# Patient Record
Sex: Female | Born: 1980 | Hispanic: No | Marital: Married | Smoking: Never smoker
Health system: Southern US, Community
[De-identification: ages and names within clinical notes are randomized; demographics above are authoritative.]

## PROBLEM LIST (undated history)

## (undated) ENCOUNTER — Inpatient Hospital Stay (HOSPITAL_COMMUNITY): Payer: Self-pay

## (undated) DIAGNOSIS — E119 Type 2 diabetes mellitus without complications: Secondary | ICD-10-CM

## (undated) DIAGNOSIS — J45909 Unspecified asthma, uncomplicated: Secondary | ICD-10-CM

## (undated) DIAGNOSIS — Z789 Other specified health status: Secondary | ICD-10-CM

---

## 2010-03-16 ENCOUNTER — Inpatient Hospital Stay (INDEPENDENT_AMBULATORY_CARE_PROVIDER_SITE_OTHER)
Admission: RE | Admit: 2010-03-16 | Discharge: 2010-03-16 | Disposition: A | Discharge status: Home / Self Care | Payer: Self-pay | Source: Ambulatory Visit

## 2010-03-16 DIAGNOSIS — N912 Amenorrhea, unspecified: Secondary | ICD-10-CM

## 2010-03-16 LAB — POCT URINALYSIS DIPSTICK
Hgb urine dipstick: NEGATIVE
Ketones, ur: 15 mg/dL — AB
Specific Gravity, Urine: <=1.005 (ref 1.005–1.030)
Urine Glucose, Fasting: NEGATIVE mg/dL
Urobilinogen, UA: 0.2 mg/dL (ref 0.0–1.0)

## 2010-03-16 LAB — POCT PREGNANCY, URINE: Preg Test, Ur: NEGATIVE

## 2011-01-18 ENCOUNTER — Other Ambulatory Visit (HOSPITAL_COMMUNITY): Payer: Self-pay | Admitting: Obstetrics and Gynecology

## 2011-01-20 ENCOUNTER — Ambulatory Visit (HOSPITAL_COMMUNITY)
Admission: RE | Admit: 2011-01-20 | Discharge: 2011-01-20 | Disposition: A | Discharge status: Home / Self Care | Payer: Medicaid Other | Source: Ambulatory Visit | Attending: Obstetrics and Gynecology | Admitting: Obstetrics and Gynecology

## 2011-01-20 DIAGNOSIS — Z3689 Encounter for other specified antenatal screening: Secondary | ICD-10-CM | POA: Insufficient documentation

## 2011-01-20 DIAGNOSIS — O365990 Maternal care for other known or suspected poor fetal growth, unspecified trimester, not applicable or unspecified: Secondary | ICD-10-CM | POA: Insufficient documentation

## 2011-02-08 ENCOUNTER — Encounter (HOSPITAL_COMMUNITY): Payer: Self-pay | Admitting: Anesthesiology

## 2011-02-08 ENCOUNTER — Encounter (HOSPITAL_COMMUNITY): Payer: Self-pay | Admitting: *Deleted

## 2011-02-08 ENCOUNTER — Inpatient Hospital Stay (HOSPITAL_COMMUNITY)
Admission: AD | Admit: 2011-02-08 | Discharge: 2011-02-11 | DRG: 765 | Disposition: A | Discharge status: Home / Self Care | Payer: Medicaid Other | Source: Ambulatory Visit | Attending: Obstetrics & Gynecology | Admitting: Obstetrics & Gynecology

## 2011-02-08 ENCOUNTER — Encounter (HOSPITAL_COMMUNITY)
Admission: AD | Disposition: A | Discharge status: Home / Self Care | Payer: Self-pay | Source: Ambulatory Visit | Attending: Obstetrics & Gynecology

## 2011-02-08 ENCOUNTER — Other Ambulatory Visit: Payer: Self-pay | Admitting: Obstetrics & Gynecology

## 2011-02-08 ENCOUNTER — Inpatient Hospital Stay (HOSPITAL_COMMUNITY): Payer: Medicaid Other | Admitting: Anesthesiology

## 2011-02-08 DIAGNOSIS — O4590 Premature separation of placenta, unspecified, unspecified trimester: Secondary | ICD-10-CM | POA: Diagnosis present

## 2011-02-08 HISTORY — DX: Other specified health status: Z78.9

## 2011-02-08 LAB — CBC
MCH: 31.9 pg (ref 26.0–34.0)
MCH: 31.4 pg (ref 26.0–34.0)
MCHC: 34.8 g/dL (ref 30.0–36.0)
MCHC: 34 g/dL (ref 30.0–36.0)
MCV: 91.9 fL (ref 78.0–100.0)
MCV: 92.3 fL (ref 78.0–100.0)
Platelets: 352 10*3/uL (ref 150–400)
Platelets: 296 10*3/uL (ref 150–400)
RBC: 4.04 MIL/uL (ref 3.87–5.11)
RDW: 13.3 % (ref 11.5–15.5)

## 2011-02-08 LAB — URINE MICROSCOPIC-ADD ON: RBC / HPF: NONE SEEN RBC/hpf (ref <3)

## 2011-02-08 LAB — URINALYSIS, ROUTINE W REFLEX MICROSCOPIC
Glucose, UA: NEGATIVE mg/dL
Ketones, ur: NEGATIVE mg/dL
Nitrite: NEGATIVE
Specific Gravity, Urine: <1.005 — ABNORMAL LOW (ref 1.005–1.030)
pH: 6.5 (ref 5.0–8.0)

## 2011-02-08 LAB — RPR: RPR Ser Ql: NONREACTIVE

## 2011-02-08 SURGERY — Surgical Case
Anesthesia: General | Site: Abdomen | Wound class: Clean Contaminated

## 2011-02-08 MED ORDER — HYDROMORPHONE 0.3 MG/ML IV SOLN
INTRAVENOUS | Status: DC
  Administered 2011-02-08: 0.2 mg via INTRAVENOUS
  Administered 2011-02-08: 05:00:00 via INTRAVENOUS
  Administered 2011-02-08: 0.2 mg via INTRAVENOUS

## 2011-02-08 MED ORDER — SODIUM CHLORIDE 0.9 % IJ SOLN: 9.0000 mL | INTRAMUSCULAR | Status: DC | PRN

## 2011-02-08 MED ORDER — IBUPROFEN 600 MG PO TABS
600.0000 mg | ORAL_TABLET | Freq: Four times a day (QID) | ORAL | Status: DC
  Administered 2011-02-08 – 2011-02-11 (×11): 600 mg via ORAL
  Filled 2011-02-08 (×11): qty 1

## 2011-02-08 MED ORDER — HYDROMORPHONE HCL PF 1 MG/ML IJ SOLN
0.2500 mg | INTRAMUSCULAR | Status: DC | PRN
  Administered 2011-02-08 (×2): 0.5 mg via INTRAVENOUS

## 2011-02-08 MED ORDER — DIPHENHYDRAMINE HCL 50 MG/ML IJ SOLN: 12.5000 mg | Freq: Four times a day (QID) | INTRAMUSCULAR | Status: DC | PRN

## 2011-02-08 MED ORDER — MORPHINE SULFATE (PF) 0.5 MG/ML IJ SOLN
INTRAMUSCULAR | Status: DC | PRN
  Administered 2011-02-08: 5 mg via INTRAVENOUS

## 2011-02-08 MED ORDER — TETANUS-DIPHTH-ACELL PERTUSSIS 5-2.5-18.5 LF-MCG/0.5 IM SUSP
0.5000 mL | Freq: Once | INTRAMUSCULAR | Status: DC
  Filled 2011-02-08: qty 0.5

## 2011-02-08 MED ORDER — CITRIC ACID-SODIUM CITRATE 334-500 MG/5ML PO SOLN
30.0000 mL | Freq: Once | ORAL | Status: DC
  Filled 2011-02-08: qty 30

## 2011-02-08 MED ORDER — NALOXONE HCL 0.4 MG/ML IJ SOLN: 0.4000 mg | INTRAMUSCULAR | Status: DC | PRN

## 2011-02-08 MED ORDER — OXYTOCIN 20 UNITS IN LACTATED RINGERS INFUSION - SIMPLE
125.0000 mL/h | INTRAVENOUS | Status: AC
  Administered 2011-02-08: 125 mL/h via INTRAVENOUS
  Filled 2011-02-08: qty 1000

## 2011-02-08 MED ORDER — ONDANSETRON HCL 4 MG/2ML IJ SOLN: 4.0000 mg | Freq: Four times a day (QID) | INTRAMUSCULAR | Status: DC | PRN

## 2011-02-08 MED ORDER — CITRIC ACID-SODIUM CITRATE 334-500 MG/5ML PO SOLN
ORAL | Status: AC
  Administered 2011-02-08: 30 mL
  Filled 2011-02-08: qty 15

## 2011-02-08 MED ORDER — DIBUCAINE 1 % RE OINT: 1.0000 "application " | TOPICAL_OINTMENT | RECTAL | Status: DC | PRN

## 2011-02-08 MED ORDER — ONDANSETRON HCL 4 MG PO TABS: 4.0000 mg | ORAL_TABLET | ORAL | Status: DC | PRN

## 2011-02-08 MED ORDER — OXYCODONE-ACETAMINOPHEN 5-325 MG PO TABS
1.0000 | ORAL_TABLET | ORAL | Status: DC | PRN
  Administered 2011-02-08: 1 via ORAL
  Filled 2011-02-08: qty 1

## 2011-02-08 MED ORDER — FAMOTIDINE IN NACL 20-0.9 MG/50ML-% IV SOLN
INTRAVENOUS | Status: AC
  Administered 2011-02-08: 20 mg
  Filled 2011-02-08: qty 50

## 2011-02-08 MED ORDER — PROPOFOL 10 MG/ML IV EMUL
INTRAVENOUS | Status: DC | PRN
  Administered 2011-02-08: 150 mL via INTRAVENOUS
  Administered 2011-02-08: 50 mL via INTRAVENOUS

## 2011-02-08 MED ORDER — FENTANYL CITRATE 0.05 MG/ML IJ SOLN
INTRAMUSCULAR | Status: DC | PRN
  Administered 2011-02-08: 100 ug via INTRAVENOUS

## 2011-02-08 MED ORDER — LANOLIN HYDROUS EX OINT: 1.0000 "application " | TOPICAL_OINTMENT | CUTANEOUS | Status: DC | PRN

## 2011-02-08 MED ORDER — OXYTOCIN 20 UNITS IN LACTATED RINGERS INFUSION - SIMPLE
INTRAVENOUS | Status: DC | PRN
  Administered 2011-02-08: 20 [IU] via INTRAVENOUS

## 2011-02-08 MED ORDER — DIPHENHYDRAMINE HCL 25 MG PO CAPS: 25.0000 mg | ORAL_CAPSULE | Freq: Four times a day (QID) | ORAL | Status: DC | PRN

## 2011-02-08 MED ORDER — SUCCINYLCHOLINE CHLORIDE 20 MG/ML IJ SOLN
INTRAMUSCULAR | Status: DC | PRN
  Administered 2011-02-08: 100 mg via INTRAVENOUS

## 2011-02-08 MED ORDER — SIMETHICONE 80 MG PO CHEW: 80.0000 mg | CHEWABLE_TABLET | ORAL | Status: DC | PRN

## 2011-02-08 MED ORDER — OXYTOCIN 20 UNITS IN LACTATED RINGERS INFUSION - SIMPLE
INTRAVENOUS | Status: AC
  Administered 2011-02-08: 125 mL/h via INTRAVENOUS
  Filled 2011-02-08: qty 1000

## 2011-02-08 MED ORDER — CITRIC ACID-SODIUM CITRATE 334-500 MG/5ML PO SOLN
ORAL | Status: AC
  Filled 2011-02-08: qty 15

## 2011-02-08 MED ORDER — ONDANSETRON HCL 4 MG/2ML IJ SOLN: 4.0000 mg | INTRAMUSCULAR | Status: DC | PRN

## 2011-02-08 MED ORDER — PRENATAL MULTIVITAMIN CH
1.0000 | ORAL_TABLET | Freq: Every day | ORAL | Status: DC
  Administered 2011-02-09 – 2011-02-11 (×3): 1 via ORAL
  Filled 2011-02-08 (×5): qty 1

## 2011-02-08 MED ORDER — ONDANSETRON HCL 4 MG/2ML IJ SOLN
INTRAMUSCULAR | Status: DC | PRN
  Administered 2011-02-08: 4 mg via INTRAVENOUS

## 2011-02-08 MED ORDER — HYDROMORPHONE 0.3 MG/ML IV SOLN
INTRAVENOUS | Status: AC
  Filled 2011-02-08: qty 25

## 2011-02-08 MED ORDER — DIPHENHYDRAMINE HCL 12.5 MG/5ML PO ELIX: 12.5000 mg | ORAL_SOLUTION | Freq: Four times a day (QID) | ORAL | Status: DC | PRN

## 2011-02-08 MED ORDER — WITCH HAZEL-GLYCERIN EX PADS: 1.0000 "application " | MEDICATED_PAD | CUTANEOUS | Status: DC | PRN

## 2011-02-08 MED ORDER — SIMETHICONE 80 MG PO CHEW
80.0000 mg | CHEWABLE_TABLET | Freq: Three times a day (TID) | ORAL | Status: DC
  Administered 2011-02-08 – 2011-02-11 (×6): 80 mg via ORAL

## 2011-02-08 MED ORDER — FAMOTIDINE IN NACL 20-0.9 MG/50ML-% IV SOLN
20.0000 mg | Freq: Once | INTRAVENOUS | Status: DC
  Filled 2011-02-08: qty 50

## 2011-02-08 MED ORDER — HYDROMORPHONE HCL PF 1 MG/ML IJ SOLN
INTRAMUSCULAR | Status: AC
  Administered 2011-02-08: 0.5 mg via INTRAVENOUS
  Filled 2011-02-08: qty 1

## 2011-02-08 MED ORDER — SENNOSIDES-DOCUSATE SODIUM 8.6-50 MG PO TABS
2.0000 | ORAL_TABLET | Freq: Every day | ORAL | Status: DC
  Administered 2011-02-08 – 2011-02-10 (×3): 2 via ORAL

## 2011-02-08 MED ORDER — LACTATED RINGERS IV SOLN
INTRAVENOUS | Status: DC | PRN
  Administered 2011-02-08: 02:00:00 via INTRAVENOUS

## 2011-02-08 MED ORDER — LACTATED RINGERS IV SOLN: INTRAVENOUS | Status: DC

## 2011-02-08 MED ORDER — CEFAZOLIN SODIUM 1-5 GM-% IV SOLN
INTRAVENOUS | Status: AC
  Administered 2011-02-08: 1000 mg
  Filled 2011-02-08: qty 50

## 2011-02-08 MED ORDER — ZOLPIDEM TARTRATE 5 MG PO TABS: 5.0000 mg | ORAL_TABLET | Freq: Every evening | ORAL | Status: DC | PRN

## 2011-02-08 MED ORDER — MENTHOL 3 MG MT LOZG: 1.0000 | LOZENGE | OROMUCOSAL | Status: DC | PRN

## 2011-02-08 SURGICAL SUPPLY — 31 items
CLOTH BEACON ORANGE TIMEOUT ST (SAFETY) ×2 IMPLANT
DRESSING TELFA 8X3 (GAUZE/BANDAGES/DRESSINGS) ×4 IMPLANT
DRSG PAD ABDOMINAL 8X10 ST (GAUZE/BANDAGES/DRESSINGS) ×2 IMPLANT
ELECT REM PT RETURN 9FT ADLT (ELECTROSURGICAL) ×2
ELECTRODE REM PT RTRN 9FT ADLT (ELECTROSURGICAL) ×1 IMPLANT
EXTRACTOR VACUUM M CUP 4 TUBE (SUCTIONS) IMPLANT
GAUZE SPONGE 4X4 12PLY STRL LF (GAUZE/BANDAGES/DRESSINGS) ×4 IMPLANT
GLOVE BIO SURGEON STRL SZ7.5 (GLOVE) ×4 IMPLANT
GOWN PREVENTION PLUS LG XLONG (DISPOSABLE) ×2 IMPLANT
GOWN PREVENTION PLUS XLARGE (GOWN DISPOSABLE) ×2 IMPLANT
KIT ABG SYR 3ML LUER SLIP (SYRINGE) ×2 IMPLANT
NEEDLE HYPO 25X5/8 SAFETYGLIDE (NEEDLE) ×2 IMPLANT
NS IRRIG 1000ML POUR BTL (IV SOLUTION) ×2 IMPLANT
PACK C SECTION WH (CUSTOM PROCEDURE TRAY) ×2 IMPLANT
PAD ABD 7.5X8 STRL (GAUZE/BANDAGES/DRESSINGS) ×2 IMPLANT
SLEEVE SCD COMPRESS KNEE MED (MISCELLANEOUS) IMPLANT
SPONGE GAUZE 4X4 12PLY (GAUZE/BANDAGES/DRESSINGS) ×2 IMPLANT
STAPLER VISISTAT 35W (STAPLE) ×2 IMPLANT
SUT PLAIN 1 NONE 54 (SUTURE) ×2 IMPLANT
SUT PROLENE 0 TP 1 60 (SUTURE) IMPLANT
SUT VIC AB 0 CT1 27 (SUTURE) ×4
SUT VIC AB 0 CT1 27XBRD ANBCTR (SUTURE) ×4 IMPLANT
SUT VIC AB 1 CTX 36 (SUTURE) ×2
SUT VIC AB 1 CTX36XBRD ANBCTRL (SUTURE) ×2 IMPLANT
SUT VIC AB 3-0 SH 27 (SUTURE)
SUT VIC AB 3-0 SH 27X BRD (SUTURE) IMPLANT
SUT VICRYL 4-0 PS2 18IN ABS (SUTURE) IMPLANT
TAPE CLOTH SURG 4X10 WHT LF (GAUZE/BANDAGES/DRESSINGS) ×2 IMPLANT
TOWEL OR 17X24 6PK STRL BLUE (TOWEL DISPOSABLE) ×4 IMPLANT
TRAY FOLEY CATH 14FR (SET/KITS/TRAYS/PACK) ×2 IMPLANT
WATER STERILE IRR 1000ML POUR (IV SOLUTION) IMPLANT

## 2011-02-08 NOTE — Op Note (Signed)
Preoperative diagnoses  #1- 34 week plus intrauterine pregnancy  #2-very nonreassuring fetal heart tracing  #3-suspected abruption  Procedure  Emergency low transverse cesarean section with delivery of 4 pound 3.8 ounce female infant-arterial cord pH 7.17  Surgeon-Dr. Aldona Bar  Anesthesia Attempted spinal-Gen. Endotracheal  History-this G1 P0 at approximately [redacted] weeks gestation presented to maternity admissions with abdominal pain. Fetal tracing was very nonreassuring-there was no variability and there were very significant prolonged decelerations -questionable late decelerations. The patient's uterus was very tight and tense feeling-the clinical picture was consistent with an ongoing concealed abruption. The best option to me was immediate delivery by cesarean section and therefore the patient was rushed to the operating room for delivery.  Procedure-there was a brief attempt to place a spinal but this was unsuccessful. Therefore general endotracheal anesthesia was carried out and while anesthesia was being carried out the patient was prepped and draped and a Foley catheter was placed as part of the prep.  Once the patient was under anesthesia procedure was begun. A Pfannenstiel incision was made and with minimal difficulty dissected down sharply to and through the fashion a low transverse fashion. The subfascial space was created inferiorly and superiorly, muscle separated in the midline, peritoneum identified and entered bluntly with care taken to avoid the bowel superiorly the bladder inferiorly. At this time the vesicouterine peritoneum was incised in low transverse fashion and pushed off the lower uterine segment with ease. Sharp incisions the uterus the low transverse fashion was then carried out with Metzenbaum scissors. Amniotomy was produced and fluid was clear. The incision was extended laterally and thereafter from a vertex position a viable female infant which was very floppy was  delivered. The infant was passed off to the waiting neonatologist and resuscitative efforts began. The infant was ultimately taken to the newborn intensive care unit. In the intensive care unit the infant seemed to be doing well-the arterial cord pH returned at 7.17. Once all the core bloods were obtained the placenta was delivered. There did not appear to be any evidence of an abruption. Placenta was sent to pathology. Thereafter the uterus was exteriorized and rendered free of any remaining products of conception. Good uterine contractile it was affordable slowly given intravenous Pitocin and manual stimulation. Tubes and ovaries appeared normal. Closure of the uterine incision was then carried out using single layer of #1 Vicryl in a running locking fashion and this was oversewn with several figure-of-eight #1 Vicryl for additional hemostat this time the uterus was replaced to the abdominal cavity the abdomen was lavaged all free blood and clot. At this time all counts were to be correct and no foreign bodies were noted to be remaining in the abdominal cavity. Closure of the abdomen was begun at this time. The abdominal peritoneum was closed with 0 Vicryl in a running fashion.  Assured of good subfascial hemostasis the fascia was then reapproximated with 0 Vicryl in midline bilaterally. Subcutaneous tissue was rendered hemostatic. Staples were then used to close the skin. A sterile pressure dressing was applied and the patient was transported to the recovery room in satisfactory condition. Estimated blood loss 500 cc. All counts correct x2.   In summary this patient presented at [redacted] weeks gestation with clinically what was felt to be a concealed abruption with a very nonreassuring fetal heart tracing. Delivery was felt to be indicated immediately. She was taken to the operating room and underwent a stat cesarean section under general endotracheal anesthesia with delivery of a 4  pound 3.8 ounce female infant  whose arterial cord pH was 7.17.  At the conclusion of the procedure both mother and baby were doing well in the respective recovery areas.

## 2011-02-08 NOTE — Progress Notes (Signed)
Pt G1 at 34.6wks having contractions x 2-3 days.  Denies bleeding or leaking.

## 2011-02-08 NOTE — Progress Notes (Addendum)
  Patient is eating, ambulating, cath in place.  Pain control is good.  Filed Vitals:   02/08/11 0415 02/08/11 0445 02/08/11 0546 02/08/11 0647  BP: 142/77 146/85 136/80   Pulse: 72 70 68   Temp: 98.3 F (36.8 C) 98.5 F (36.9 C) 98.4 F (36.9 C)   TempSrc:   Oral   Resp: 20 18 18 18   Height:      Weight:      SpO2: 100% 100% 99% 100%    lungs:   clear to auscultation cor:    RRR Abdomen:  soft, appropriate tenderness, incisions intact and without erythema or exudate ex:    no cords   Lab Results  Component Value Date   WBC 12.3* 02/08/2011   HGB 12.7 02/08/2011   HCT 37.3 02/08/2011   MCV 92.3 02/08/2011   PLT 296 02/08/2011    --/--/A NEG (01/02 0145)/RI  A/P    Post operative day 0.  Routine post op and postpartum care.  Expect d/c in two to three days.  Percocet for pain control.

## 2011-02-08 NOTE — Progress Notes (Signed)
Pt having  Decelerations.IV bolus initiated pt turned to left side intially and right side and left side again O2 started at 75ml/min

## 2011-02-08 NOTE — Transfer of Care (Signed)
Immediate Anesthesia Transfer of Care Note  Patient: Stacey Graves  Procedure(s) Performed:  CESAREAN SECTION  Patient Location: PACU  Anesthesia Type: General  Level of Consciousness: alert  and oriented  Airway & Oxygen Therapy: Patient Spontanous Breathing and Patient connected to face mask oxygen  Post-op Assessment: Report given to PACU RN and Post -op Vital signs reviewed and stable  Post vital signs: stable  Complications: No apparent anesthesia complications

## 2011-02-08 NOTE — Anesthesia Preprocedure Evaluation (Signed)
Anesthesia Evaluation  Patient identified by MRN, date of birth, ID band Patient awake    Reviewed: Allergy & Precautions, H&P , Patient's Chart, lab work & pertinent test results  Airway Mallampati: II TM Distance: >3 FB Neck ROM: full    Dental No notable dental hx.    Pulmonary  clear to auscultation  Pulmonary exam normal       Cardiovascular Exercise Tolerance: Good regular Normal    Neuro/Psych    GI/Hepatic   Endo/Other    Renal/GU      Musculoskeletal   Abdominal   Peds  Hematology   Anesthesia Other Findings   Reproductive/Obstetrics                          Anesthesia Physical Anesthesia Plan  ASA: II and Emergent  Anesthesia Plan: Spinal   Post-op Pain Management:    Induction:   Airway Management Planned:   Additional Equipment:   Intra-op Plan:   Post-operative Plan:   Informed Consent: I have reviewed the patients History and Physical, chart, labs and discussed the procedure including the risks, benefits and alternatives for the proposed anesthesia with the patient or authorized representative who has indicated his/her understanding and acceptance.   Dental Advisory Given  Plan Discussed with: CRNA  Anesthesia Plan Comments: (Lab work confirmed with CRNA in room. Platelets okay. Discussed spinal anesthetic, and patient consents to the procedure:  included risk of possible headache,backache, failed block, allergic reaction, and nerve injury. This patient was asked if she had any questions or concerns before the procedure started. )       Anesthesia Quick Evaluation  

## 2011-02-08 NOTE — Addendum Note (Signed)
Addendum  created 02/08/11 1610 by Doreene Burke   Modules edited:Notes Section

## 2011-02-08 NOTE — ED Provider Notes (Signed)
History   Pt presents today c/o abd cramping for the past 2 days. She states she could not sleep so she presented to MAU for evaluation. She reports GFM and denies vag bleeding.  Chief Complaint  Patient presents with  . Contractions   HPI  OB History    Grav Para Term Preterm Abortions TAB SAB Ect Mult Living   1               Past Medical History  Diagnosis Date  . No pertinent past medical history     Past Surgical History  Procedure Date  . No past surgeries     Family History  Problem Relation Age of Onset  . Hypertension Mother   . Asthma Father     History  Substance Use Topics  . Smoking status: Never Smoker   . Smokeless tobacco: Not on file  . Alcohol Use: No    Allergies: No Known Allergies  No prescriptions prior to admission    Review of Systems  Constitutional: Negative for fever.  Eyes: Negative for blurred vision.  Cardiovascular: Negative for chest pain.  Gastrointestinal: Positive for abdominal pain. Negative for nausea, vomiting, diarrhea and constipation.  Genitourinary: Negative for dysuria, urgency, frequency and hematuria.  Neurological: Negative for dizziness and headaches.  Psychiatric/Behavioral: Negative for depression and suicidal ideas.   Physical Exam   Blood pressure 124/78, pulse 82, temperature 98.7 F (37.1 C), temperature source Oral, resp. rate 18, height 5\' 1"  (1.549 m), weight 136 lb 12.8 oz (62.052 kg).  Physical Exam  Nursing note and vitals reviewed. Constitutional: She is oriented to person, place, and time. She appears well-developed and well-nourished. No distress.  HENT:  Head: Normocephalic and atraumatic.  Eyes: EOM are normal. Pupils are equal, round, and reactive to light.  GI: Soft. She exhibits no distension. There is no tenderness. There is no rebound and no guarding.  Genitourinary: No bleeding around the vagina. No vaginal discharge found.       Cervix Cl/70/-2.  Neurological: She is alert and  oriented to person, place, and time.  Skin: Skin is warm and dry. She is not diaphoretic.  Psychiatric: She has a normal mood and affect. Her behavior is normal. Judgment and thought content normal.    MAU Course  Procedures  NST with repetitive decelerations.  Dr. Aldona Bar notified and on his way.  Assessment and Plan  Dr Aldona Bar at bedside at 0200 and is assuming care of pt.  Clinton Gallant. Rice III, DrHSc, MPAS, PA-C  02/08/2011, 1:52 AM   Henrietta Hoover, PA 02/08/11 0201

## 2011-02-08 NOTE — Anesthesia Postprocedure Evaluation (Signed)
Anesthesia Post Note  Patient: Stacey Graves  Procedure(s) Performed:  CESAREAN SECTION  Anesthesia type: General  Patient location: Mother/Baby  Post pain: Pain level controlled  Post assessment: Post-op Vital signs reviewed  Last Vitals:  Filed Vitals:   02/08/11 0647  BP:   Pulse:   Temp:   Resp: 18    Post vital signs: Reviewed  Level of consciousness: awake and alert   Complications: No apparent anesthesia complications

## 2011-02-08 NOTE — Anesthesia Postprocedure Evaluation (Signed)
  Anesthesia Post-op Note  Patient: Stacey Graves  Procedure(s) Performed:  CESAREAN SECTION Patient is awake and responsive. Pain and nausea are reasonably well controlled. Vital signs are stable and clinically acceptable. Oxygen saturation is clinically acceptable. There are no apparent anesthetic complications at this time. Patient is ready for discharge.

## 2011-02-08 NOTE — Progress Notes (Signed)
Pt states she started feeling pain about 1 hour ago

## 2011-02-08 NOTE — H&P (Signed)
G39P32 31 year old presentsd with one hour of Abd pain (?contractions) and on presentation to MAU, FHT VERY N-R.  Possible Abruption.  To OR for C/S ASAP.  VS;  Stable ABD:  Tense abd.  S=D CX Closed  IMP 34 weeks, Fetal Distress //Abruption  Plan:  STAT C/S.

## 2011-02-08 NOTE — Progress Notes (Signed)
Pt to OR via stretcher with O2 via facemask at 10L/min.

## 2011-02-08 NOTE — Progress Notes (Signed)
UR chart review completed.  

## 2011-02-09 ENCOUNTER — Encounter (HOSPITAL_COMMUNITY): Payer: Self-pay | Admitting: Obstetrics & Gynecology

## 2011-02-09 NOTE — Progress Notes (Signed)
Patient ID: Stacey Graves, female   DOB: 27-Sep-1980, 31 y.o.   MRN: [id number]  Pt off floor in NICU. Unable to examine

## 2011-02-09 NOTE — Progress Notes (Signed)
PSYCHOSOCIAL ASSESSMENT ~ MATERNAL/CHILD Name: Baby girl Stengel "Stacey Graves"                                                             Age: 31   Referral Date: 02/09/11 Reason/Source: NICU support  I. FAMILY/HOME ENVIRONMENT Child's Legal Guardian __x_Parent(s) ___Grandparent ___Foster parent ___DSS_________________ Name: Orpah Cobb                                           DOB: [birth year]             Age: 57   Address: [address] [place], [place] [address]  Name: Granville Lewis                                                    DOB: //                     Age:   Address: same  Other Household Members/Support Persons        Name:                                         Relationship:                        DOB ___/___/___                   Name:                                         Relationship:                        DOB ___/___/___                   Name:                                         Relationship:                        DOB ___/___/___                   Name:                                         Relationship:                        DOB ___/___/___  C. 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    STRENGTHS _x__Supportive family/friends _x__Adequate Resources _x__Compliance with medical plan _x__Home prepared for Child (including basic supplies) _x__Understanding of illness      _x__Other: SW gave pediatrician list  RISK FACTORS AND CURRENT PROBLEMS         __x__No Problems Noted                                                                                                                                                                                                                                       Pt              Family     Substance Abuse                                                                ___              ___        Mental Illness  ___              ___  Family/Relationship Issues                                      ___               ___             Abuse/Neglect/Domestic Violence                                         ___         ___  Financial Resources                                        ___              ___             Transportation                                                                        ___               ___  DSS Involvement                                                                   ___              ___  Adjustment to Illness                                                               ___              ___  Knowledge/Cognitive Deficit                                                   ___              ___             Compliance with Treatment                                                 ___                ___  Basic Needs (food, housing, etc.)                                          ___              ___             Housing Concerns                                       ___              ___ Other_____________________________________________________________            SOCIAL WORK ASSESSMENT SW met with MOB and her good friend MOB's third  floor room to introduce myself, complete assessment and evaluate how family is coping with baby's premature birth and admission to NICU.  MOB was quiet, but pleasant and stated that she speaks [place], but her friend is more fluent and can help if there is anything she doesn't understand.  She reports having good supports and everything she needs for baby at home.  MOB told SW that she has lived in [place] for one year and is originally from [place].  She states no questions about the NICU other than wanting to know when the baby is going to be able to go home.  SW explained that there is no way to predict what day the baby will be ready for discharge at this point, but discussed the typical milestones a NICU baby has to meet before being able to go home.  MOB was very appreciative and seems to be coping well with the situation.  She states that FOB is involved and supportive, but is at work today.  They have no issues with transportation in order to visit baby if MOB is discharged first.  SW explained support services offered by NICU SWs, and gave contact information and a pediatrician list.  SOCIAL WORK PLAN  ___No Further Intervention Required/No Barriers to Discharge   _x__Psychosocial Support and Ongoing Assessment of Needs   ___Patient/Family Education:   ___Child Protective Services Report   County___________ Date___/____/____   ___Information/Referral to [employer]_________________________   ___Other:

## 2011-02-09 NOTE — Progress Notes (Signed)
Subjective: Postpartum Day 1: Cesarean Delivery Patient reports incisional pain, tolerating PO, + flatus and no problems voiding.  There is some language barrier.  I discussed patients care with the RN.  The patient has not communicate any concerns to her  Objective: Vital signs in last 24 hours: Temp:  [98.4 F (36.9 C)-99.8 F (37.7 C)] 98.7 F (37.1 C) (01/03 0537) Pulse Rate:  [71-97] 97  (01/03 0537) Resp:  [18] 18  (01/03 0537) BP: (99-138)/(68-83) 99/70 mmHg (01/03 0537) SpO2:  [97 %] 97 % (01/03 0537)  Physical Exam:  General: alert, cooperative and appears stated age Lochia: appropriate Uterine Fundus: firm Incision: dressing intact DVT Evaluation: No evidence of DVT seen on physical exam.   Basename 02/08/11 0559 02/08/11 0145  HGB 12.7 13.8  HCT 37.3 39.7    Assessment/Plan: Status post Cesarean section. Doing well postoperatively.  Continue current care.   Airabella Barley H. 02/09/2011, 10:14 AM

## 2011-02-10 LAB — TYPE AND SCREEN

## 2011-02-10 NOTE — Progress Notes (Signed)
Post Op Day 2 Subjective: no complaints  Objective: Blood pressure 140/83, pulse 61, temperature 97.9 F.  Breastfeeding (pumping, baby in NICU).  Physical Exam:  General: alert Lochia: appropriate Uterine Fundus: firm Incision: healing well   Basename 02/08/11 0559 02/08/11 0145  HGB 12.7 13.8  HCT 37.3 39.7    Assessment/Plan: Plan for discharge tomorrow   LOS: 2 days   Stacey Graves D 02/10/2011, 8:49 AM

## 2011-02-11 MED ORDER — OXYCODONE-ACETAMINOPHEN 5-325 MG PO TABS: 1.0000 | ORAL_TABLET | ORAL | Status: AC | PRN

## 2011-02-11 NOTE — Discharge Summary (Signed)
Obstetric Discharge Summary Reason for Admission: cesarean section and for abdominal pain and category 3 fetal heart rate tracing Prenatal Procedures: ultrasound Intrapartum Procedures: cesarean: low cervical, transverse Postpartum Procedures: none Complications-Operative and Postpartum: none Hemoglobin  Date Value Range Status  02/08/2011 12.7  12.0-15.0 (g/dL) Final     HCT  Date Value Range Status  02/08/2011 37.3  36.0-46.0 (%) Final    Discharge Diagnoses: Preterm delivery by cesarean for unexplained abdominal pain and category 3 fetal heart rate tracing  Discharge Information: Date: 02/11/2011 Activity: Limited Diet: routine Medications: PNV, Ibuprofen and Percocet Condition: stable Instructions: refer to practice specific booklet Discharge to: home Follow-up Information    Follow up with Caprice Beaver. Make an appointment in 4 weeks.   Contact information:   [address] [place] [address] [phone]          Newborn Data: Live born female  Birth Weight: 4 lb 3.8 oz (1923 g) APGAR: 5, 7  Baby doing well in NICU.  Caysie Minnifield D 02/11/2011, 10:18 AM

## 2011-02-11 NOTE — Progress Notes (Signed)
Post Op Day 3 Subjective: no complaints, voiding, tolerating PO and + flatus  Objective: Blood pressure 128/84, pulse 68, temperature 98.5 F   Physical Exam:  General: alert Lochia: appropriate Uterine Fundus: firm Incision: healing well  Assessment/Plan: Discharge home   LOS: 3 days   London Nonaka D 02/11/2011, 10:07 AM

## 2012-08-09 ENCOUNTER — Inpatient Hospital Stay (HOSPITAL_COMMUNITY)
Admission: AD | Admit: 2012-08-09 | Discharge: 2012-08-09 | Disposition: A | Discharge status: Home / Self Care | Payer: Medicaid Other | Source: Ambulatory Visit | Attending: Obstetrics & Gynecology | Admitting: Obstetrics & Gynecology

## 2012-08-09 ENCOUNTER — Encounter (HOSPITAL_COMMUNITY): Payer: Self-pay | Admitting: *Deleted

## 2012-08-09 DIAGNOSIS — O210 Mild hyperemesis gravidarum: Secondary | ICD-10-CM | POA: Insufficient documentation

## 2012-08-09 DIAGNOSIS — O219 Vomiting of pregnancy, unspecified: Secondary | ICD-10-CM

## 2012-08-09 DIAGNOSIS — R109 Unspecified abdominal pain: Secondary | ICD-10-CM | POA: Insufficient documentation

## 2012-08-09 LAB — URINALYSIS, ROUTINE W REFLEX MICROSCOPIC
Bilirubin Urine: NEGATIVE
Ketones, ur: NEGATIVE mg/dL
Nitrite: NEGATIVE
Protein, ur: 30 mg/dL — AB
pH: 6.5 (ref 5.0–8.0)

## 2012-08-09 LAB — URINE MICROSCOPIC-ADD ON

## 2012-08-09 MED ORDER — ONDANSETRON HCL 4 MG PO TABS: 4.0000 mg | ORAL_TABLET | Freq: Three times a day (TID) | ORAL | Status: DC | PRN

## 2012-08-09 MED ORDER — ONDANSETRON 8 MG PO TBDP
8.0000 mg | ORAL_TABLET | Freq: Once | ORAL | Status: AC
  Administered 2012-08-09: 8 mg via ORAL
  Filled 2012-08-09: qty 1

## 2012-08-09 NOTE — MAU Provider Note (Signed)
History     CSN: [id number]  Arrival date and time: 08/09/12 1129   First Provider Initiated Contact with Patient 08/09/12 1216      Chief Complaint  Patient presents with  . Emesis  . Abdominal Cramping   HPI Stacey Graves is 32 y.o. G2P0101 [redacted]w[redacted]d weeks presenting with nausea and vomiting in early pregnancy.  She has appointment with [facility] to begin prenatal care on 08/12/12.  Reports nausea for 2 months, daily.  Vomits 3-4 X day.  Is able to keep food and fluid down sometimes.  Denies vaginal bleeding or discharge.  Denies abdominal pain except when vomiting.  Denies fever and UTI sxs.    Past Medical History  Diagnosis Date  . No pertinent past medical history   . Preterm delivery     Past Surgical History  Procedure Laterality Date  . No past surgeries    . Cesarean section  02/08/2011    Procedure: CESAREAN SECTION;  Surgeon: Caprice Beaver;  Location: [facility];  Service: Gynecology;  Laterality: N/A;    Family History  Problem Relation Age of Onset  . Hypertension Mother   . Asthma Father     History  Substance Use Topics  . Smoking status: Never Smoker   . Smokeless tobacco: Never Used  . Alcohol Use: No    Allergies: No Known Allergies  Prescriptions prior to admission  Medication Sig Dispense Refill  . Prenatal Vit-Fe Fumarate-FA (PRENATAL MULTIVITAMIN) TABS Take 1 tablet by mouth daily.         Review of Systems  Constitutional: Negative for fever and chills.  Gastrointestinal: Positive for nausea. Negative for vomiting.  Genitourinary: Negative for dysuria, urgency, frequency, hematuria and flank pain.       Neg for vaginal bleeding or discharge   Physical Exam   Blood pressure 113/65, pulse 98, temperature 98.2 F (36.8 C), temperature source Oral, resp. rate 18, height 5\' 1"  (1.549 m), weight 115 lb 6.4 oz (52.345 kg), last menstrual period 05/22/2012.  Physical Exam  Constitutional: She is oriented to person, place, and time. She appears well-developed  and well-nourished. No distress.  HENT:  Head: Normocephalic.  Neck: Normal range of motion.  Cardiovascular: Normal rate.   Respiratory: Effort normal.  GI: Soft. She exhibits no distension and no mass. There is no tenderness. There is no rebound and no guarding.  Genitourinary:  Not indicated.  FHR 168 by doppler  Neurological: She is alert and oriented to person, place, and time.  Skin: Skin is warm and dry.  Psychiatric: She has a normal mood and affect. Her behavior is normal.   Results for orders placed during the hospital encounter of 08/09/12 (from the past 24 hour(s))  URINALYSIS, ROUTINE W REFLEX MICROSCOPIC     Status: Abnormal   Collection Time    08/09/12 11:35 AM      Result Value Range   Color, Urine YELLOW  YELLOW   APPearance HAZY (*) CLEAR   Specific Gravity, Urine >1.030 (*) 1.005 - 1.030   pH 6.5  5.0 - 8.0   Glucose, UA NEGATIVE  NEGATIVE mg/dL   Hgb urine dipstick NEGATIVE  NEGATIVE   Bilirubin Urine NEGATIVE  NEGATIVE   Ketones, ur NEGATIVE  NEGATIVE mg/dL   Protein, ur 30 (*) NEGATIVE mg/dL   Urobilinogen, UA 0.2  0.0 - 1.0 mg/dL   Nitrite NEGATIVE  NEGATIVE   Leukocytes, UA SMALL (*) NEGATIVE  URINE MICROSCOPIC-ADD ON     Status: Abnormal  Collection Time    08/09/12 11:35 AM      Result Value Range   Squamous Epithelial / LPF FEW (*) RARE   WBC, UA 3-6  <3 WBC/hpf   RBC / HPF 0-2  <3 RBC/hpf   Bacteria, UA MANY (*) RARE   Urine-Other MUCOUS PRESENT    POCT PREGNANCY, URINE     Status: Abnormal   Collection Time    08/09/12 11:45 AM      Result Value Range   Preg Test, Ur POSITIVE (*) NEGATIVE    MAU Course  Procedures  Urine culture to lab  MDM Zofran 8mg  ODT given in MAU with good response.  Discussed medications for home for her nausea.  She does not want to be sleepy.  Will Rx Zofran 4mg  tabs Assessment and Plan  A:  Nausea and vomiting in first trimester pregnancy]      Viable pregnancy with FHR 168 by doppler  P:  Rx for Zofran  4mg  tabs to pharmacy      Encouraged patient to keep appointment with [facility] for Monday July 7.      Discussed diet   KEY,EVE M 08/09/2012, 12:19 PM

## 2012-08-09 NOTE — MAU Provider Note (Signed)
Attestation of Attending Supervision of Advanced Practitioner (CNM/NP): Evaluation and management procedures were performed by the Advanced Practitioner under my supervision and collaboration. I have reviewed the Advanced Practitioner's note and chart, and I agree with the management and plan.  Vadim Centola H. 2:25 PM   

## 2012-08-09 NOTE — MAU Note (Signed)
Patient states she has had a positive pregnancy test at Urgent Care. States she has had nausea and vomiting for a while and some abdominal cramping.

## 2012-08-11 LAB — URINE CULTURE: Colony Count: 65000

## 2012-08-12 LAB — OB RESULTS CONSOLE GBS: STREP GROUP B AG: POSITIVE

## 2012-09-23 LAB — OB RESULTS CONSOLE HEPATITIS B SURFACE ANTIGEN: Hepatitis B Surface Ag: NEGATIVE

## 2012-09-23 LAB — OB RESULTS CONSOLE ABO/RH: RH Type: NEGATIVE

## 2012-09-23 LAB — OB RESULTS CONSOLE HIV ANTIBODY (ROUTINE TESTING): HIV: NONREACTIVE

## 2012-09-23 LAB — OB RESULTS CONSOLE GC/CHLAMYDIA
Chlamydia: NEGATIVE
Gonorrhea: NEGATIVE

## 2012-09-23 LAB — OB RESULTS CONSOLE RUBELLA ANTIBODY, IGM: Rubella: IMMUNE

## 2012-09-23 LAB — OB RESULTS CONSOLE RPR: RPR: NONREACTIVE

## 2012-09-23 LAB — OB RESULTS CONSOLE ANTIBODY SCREEN: ANTIBODY SCREEN: NEGATIVE

## 2012-11-19 ENCOUNTER — Other Ambulatory Visit: Payer: Self-pay | Admitting: Obstetrics and Gynecology

## 2012-11-19 ENCOUNTER — Ambulatory Visit (HOSPITAL_COMMUNITY)
Admission: RE | Admit: 2012-11-19 | Discharge: 2012-11-19 | Disposition: A | Discharge status: Home / Self Care | Payer: Medicaid Other | Source: Ambulatory Visit | Attending: Obstetrics and Gynecology | Admitting: Obstetrics and Gynecology

## 2012-11-19 DIAGNOSIS — IMO0002 Reserved for concepts with insufficient information to code with codable children: Secondary | ICD-10-CM

## 2012-11-19 DIAGNOSIS — O365990 Maternal care for other known or suspected poor fetal growth, unspecified trimester, not applicable or unspecified: Secondary | ICD-10-CM | POA: Insufficient documentation

## 2012-11-19 DIAGNOSIS — Z3689 Encounter for other specified antenatal screening: Secondary | ICD-10-CM

## 2012-11-19 DIAGNOSIS — O34219 Maternal care for unspecified type scar from previous cesarean delivery: Secondary | ICD-10-CM | POA: Insufficient documentation

## 2012-11-19 DIAGNOSIS — Z0374 Encounter for suspected problem with fetal growth ruled out: Secondary | ICD-10-CM

## 2012-11-19 DIAGNOSIS — Z1389 Encounter for screening for other disorder: Secondary | ICD-10-CM | POA: Insufficient documentation

## 2012-11-19 DIAGNOSIS — O358XX0 Maternal care for other (suspected) fetal abnormality and damage, not applicable or unspecified: Secondary | ICD-10-CM | POA: Insufficient documentation

## 2012-11-19 DIAGNOSIS — Z363 Encounter for antenatal screening for malformations: Secondary | ICD-10-CM | POA: Insufficient documentation

## 2012-11-29 ENCOUNTER — Other Ambulatory Visit (HOSPITAL_COMMUNITY): Payer: Medicaid Other

## 2012-11-29 ENCOUNTER — Encounter (HOSPITAL_COMMUNITY): Payer: Medicaid Other

## 2012-12-10 ENCOUNTER — Other Ambulatory Visit: Payer: Self-pay | Admitting: Obstetrics and Gynecology

## 2012-12-10 DIAGNOSIS — O365991 Maternal care for other known or suspected poor fetal growth, unspecified trimester, fetus 1: Secondary | ICD-10-CM

## 2012-12-11 ENCOUNTER — Ambulatory Visit (HOSPITAL_COMMUNITY)
Admission: RE | Admit: 2012-12-11 | Discharge: 2012-12-11 | Disposition: A | Discharge status: Home / Self Care | Payer: Medicaid Other | Source: Ambulatory Visit | Attending: Obstetrics and Gynecology | Admitting: Obstetrics and Gynecology

## 2012-12-11 DIAGNOSIS — O365991 Maternal care for other known or suspected poor fetal growth, unspecified trimester, fetus 1: Secondary | ICD-10-CM

## 2012-12-11 DIAGNOSIS — O34219 Maternal care for unspecified type scar from previous cesarean delivery: Secondary | ICD-10-CM | POA: Insufficient documentation

## 2012-12-11 DIAGNOSIS — Z8751 Personal history of pre-term labor: Secondary | ICD-10-CM | POA: Insufficient documentation

## 2012-12-11 DIAGNOSIS — O365990 Maternal care for other known or suspected poor fetal growth, unspecified trimester, not applicable or unspecified: Secondary | ICD-10-CM | POA: Insufficient documentation

## 2013-01-05 ENCOUNTER — Inpatient Hospital Stay (HOSPITAL_COMMUNITY)
Admission: AD | Admit: 2013-01-05 | Discharge: 2013-01-05 | Disposition: A | Discharge status: Home / Self Care | Payer: Medicaid Other | Source: Ambulatory Visit | Attending: Obstetrics and Gynecology | Admitting: Obstetrics and Gynecology

## 2013-01-05 ENCOUNTER — Encounter (HOSPITAL_COMMUNITY): Payer: Self-pay

## 2013-01-05 DIAGNOSIS — O99891 Other specified diseases and conditions complicating pregnancy: Secondary | ICD-10-CM | POA: Insufficient documentation

## 2013-01-05 DIAGNOSIS — K055 Other periodontal diseases: Secondary | ICD-10-CM | POA: Insufficient documentation

## 2013-01-05 DIAGNOSIS — Z789 Other specified health status: Secondary | ICD-10-CM

## 2013-01-05 DIAGNOSIS — IMO0002 Reserved for concepts with insufficient information to code with codable children: Secondary | ICD-10-CM | POA: Diagnosis present

## 2013-01-05 DIAGNOSIS — O34219 Maternal care for unspecified type scar from previous cesarean delivery: Secondary | ICD-10-CM | POA: Diagnosis not present

## 2013-01-05 DIAGNOSIS — Z6791 Unspecified blood type, Rh negative: Secondary | ICD-10-CM | POA: Diagnosis present

## 2013-01-05 DIAGNOSIS — Z758 Other problems related to medical facilities and other health care: Secondary | ICD-10-CM

## 2013-01-05 DIAGNOSIS — Z87898 Personal history of other specified conditions: Secondary | ICD-10-CM

## 2013-01-05 DIAGNOSIS — O99820 Streptococcus B carrier state complicating pregnancy: Secondary | ICD-10-CM

## 2013-01-05 LAB — WET PREP, GENITAL: Clue Cells Wet Prep HPF POC: NONE SEEN

## 2013-01-05 NOTE — MAU Note (Signed)
Pt/spouse state one hour ago pt began randomly having bleeding from her left upper gum. Did not chew anything hard. Denies issues with pregnancy, no bleeding or vag d/c changes.

## 2013-01-06 LAB — GC/CHLAMYDIA PROBE AMP: GC Probe RNA: NEGATIVE

## 2013-01-07 ENCOUNTER — Other Ambulatory Visit: Payer: Self-pay | Admitting: Obstetrics and Gynecology

## 2013-01-07 DIAGNOSIS — Z87898 Personal history of other specified conditions: Secondary | ICD-10-CM

## 2013-01-07 DIAGNOSIS — IMO0002 Reserved for concepts with insufficient information to code with codable children: Secondary | ICD-10-CM

## 2013-01-08 ENCOUNTER — Ambulatory Visit (HOSPITAL_COMMUNITY)
Admission: RE | Admit: 2013-01-08 | Discharge: 2013-01-08 | Disposition: A | Discharge status: Home / Self Care | Payer: Medicaid Other | Source: Ambulatory Visit | Attending: Obstetrics and Gynecology | Admitting: Obstetrics and Gynecology

## 2013-01-08 DIAGNOSIS — IMO0002 Reserved for concepts with insufficient information to code with codable children: Secondary | ICD-10-CM

## 2013-01-08 DIAGNOSIS — Z87898 Personal history of other specified conditions: Secondary | ICD-10-CM

## 2013-01-08 DIAGNOSIS — Z8751 Personal history of pre-term labor: Secondary | ICD-10-CM | POA: Insufficient documentation

## 2013-01-08 DIAGNOSIS — O34219 Maternal care for unspecified type scar from previous cesarean delivery: Secondary | ICD-10-CM | POA: Insufficient documentation

## 2013-01-08 DIAGNOSIS — O365990 Maternal care for other known or suspected poor fetal growth, unspecified trimester, not applicable or unspecified: Secondary | ICD-10-CM | POA: Insufficient documentation

## 2013-02-04 ENCOUNTER — Other Ambulatory Visit: Payer: Self-pay | Admitting: Obstetrics and Gynecology

## 2013-02-04 DIAGNOSIS — Z87898 Personal history of other specified conditions: Secondary | ICD-10-CM

## 2013-02-04 DIAGNOSIS — IMO0002 Reserved for concepts with insufficient information to code with codable children: Secondary | ICD-10-CM

## 2013-02-05 ENCOUNTER — Ambulatory Visit (HOSPITAL_COMMUNITY)
Admission: RE | Admit: 2013-02-05 | Discharge: 2013-02-05 | Disposition: A | Discharge status: Home / Self Care | Payer: Medicaid Other | Source: Ambulatory Visit | Attending: Obstetrics and Gynecology | Admitting: Obstetrics and Gynecology

## 2013-02-05 DIAGNOSIS — Z87898 Personal history of other specified conditions: Secondary | ICD-10-CM

## 2013-02-05 DIAGNOSIS — O365990 Maternal care for other known or suspected poor fetal growth, unspecified trimester, not applicable or unspecified: Secondary | ICD-10-CM | POA: Insufficient documentation

## 2013-02-05 DIAGNOSIS — O34219 Maternal care for unspecified type scar from previous cesarean delivery: Secondary | ICD-10-CM | POA: Insufficient documentation

## 2013-02-05 DIAGNOSIS — Z8751 Personal history of pre-term labor: Secondary | ICD-10-CM | POA: Insufficient documentation

## 2013-02-05 DIAGNOSIS — IMO0002 Reserved for concepts with insufficient information to code with codable children: Secondary | ICD-10-CM

## 2013-02-05 NOTE — Progress Notes (Signed)
Pierre Parcher  was seen today for an ultrasound appointment.  See full report in [facility].  Impression: Single IUP at 37 0/7 weeks Overall fetal growth is appropriate (21st %tile).  [facility] measures at the 24th %tile. 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   Alpha Gula, MD

## 2013-02-06 NOTE — L&D Delivery Note (Signed)
Delivery Note   [facility] pt began pushing about 10pm, with report from RN, difficulty w pt pushing effectively. Pt denies pain, but does feel pressure w ctx,  FHR cat 2, with intermittent mod variables w good recovery and mod variability. vtx at +2/+3 station, temp increased to 100.4, abx switched from PCN to unasyn 3g.  Position changes and continued pushing, [facility] for [facility] and considering vacuum assist. Vtx continued to descend and FHR overall reassuring, pt continued to be pushing ineffectively despite coaching. Ctx also not strong at times.    At 11:41 PM a viable female was delivered via VBAC, Spontaneous (Presentation: ; Occiput Anterior).  Reduced loose nuchal cord, APGAR: 8, 9; weight 6 lb 4.5 oz (2849 g).   Placenta status: Intact, Spontaneous.  To pathology, Cord: 2 vessels with the following complications: None.  Cord pH: not collected  Anesthesia: Epidural  Episiotomy: None Lacerations: 2nd degree repaired in the usual fashion, and superficial L labial laceration w 2 stiches for hemostasis, Superficial skin split on R labia not repaired  Suture Repair: chromic vicryl rapide 4-0 monocryl  Est. Blood Loss (mL): 300  cytotec 1000mcg PR for slightly boggy uterus during repair   Mom to postpartum.  Baby to Couplet care / Skin to Skin. Pt plans to BF BP's noted to still be elevated, will repeat PIH labs w CBC in the am, CTO closely  Will d/c abx for now, unless fever persists   Stacey Graves M 02/21/2013, 1:36 AM

## 2013-02-09 ENCOUNTER — Encounter (HOSPITAL_COMMUNITY): Payer: Self-pay | Admitting: *Deleted

## 2013-02-09 ENCOUNTER — Inpatient Hospital Stay (HOSPITAL_COMMUNITY)
Admission: AD | Admit: 2013-02-09 | Discharge: 2013-02-09 | Disposition: A | Discharge status: Home / Self Care | Payer: Medicaid Other | Source: Ambulatory Visit | Attending: Obstetrics and Gynecology | Admitting: Obstetrics and Gynecology

## 2013-02-09 DIAGNOSIS — O360990 Maternal care for other rhesus isoimmunization, unspecified trimester, not applicable or unspecified: Secondary | ICD-10-CM | POA: Insufficient documentation

## 2013-02-09 DIAGNOSIS — O368190 Decreased fetal movements, unspecified trimester, not applicable or unspecified: Secondary | ICD-10-CM | POA: Insufficient documentation

## 2013-02-09 DIAGNOSIS — O9989 Other specified diseases and conditions complicating pregnancy, childbirth and the puerperium: Secondary | ICD-10-CM

## 2013-02-09 DIAGNOSIS — Z22330 Carrier of Group B streptococcus: Secondary | ICD-10-CM | POA: Insufficient documentation

## 2013-02-09 DIAGNOSIS — O99891 Other specified diseases and conditions complicating pregnancy: Secondary | ICD-10-CM | POA: Insufficient documentation

## 2013-02-09 HISTORY — DX: Other specified health status: Z78.9

## 2013-02-09 NOTE — Discharge Instructions (Signed)
Fetal Monitoring, Fetal Movement Assessment Fetal movement assessment (FMA) is done by the pregnant woman herself by counting and recording the baby's movements over a certain time period. It is done to see if there are problems with the pregnancy and the baby. Identifying and correcting problems may prevent serious problems from developing with the fetus, including fetal loss. Some pregnancies are complicated by the mother's medical problems. Some of these problems are type 1 diabetes mellitus, high blood pressure and other chronic medical illnesses. This is why it is important to monitor the baby before birth.  OTHER TECHNIQUES OF MONITORING YOUR BABY BEFORE BIRTH Several tests are in use. These include:  Nonstress test (NST). This test monitors the baby's heart rate when the baby moves.  Contraction stress test (CST). This test monitors the baby's heart rate during a contraction of the uterus.  Fetal biophysical profile (BPP). This measures and evaluates 5 observations of the baby:  The nonstress test.  The baby's breathing.  The baby's movements.  The baby's muscle tone.  The amount of amniotic fluid.  Modified BPP. This measures the volume of fluid in different parts of the amniotic sac (amniotic fluid index) and the results of the nonstress test.  Umbilical artery doppler velocimetry. This evaluates the blood flow through the umbilical cord. There are several very serious problems that cannot be predicted or detected with any of the fetal monitoring procedures. These problems include separation (abruption) of the placenta or when the fetus chokes on the umbilical cord (umbilical cord accident). Your caregiver will help you understand your tests and what they mean for you and your baby. It is your responsibility to obtain the results of your test. LET YOUR CAREGIVER KNOW ABOUT:   Any medications you are taking including prescription and over-the-counter drugs, herbs, eye drops and  creams.  If you have a fever.  If you have an infection.  If you are sick. RISKS AND COMPLICATIONS  There are no risks or complications to the mother or fetus with FMA. BEFORE THE PROCEDURE  Do not take medications that may decrease or increase the baby's heart rate and/or movements.  Eat a full meal at least 2 hours before the test.  Do not smoke if you are pregnant. If you smoke, stop at least 2 days before the test. It is best not to smoke at all when you are pregnant. PROCEDURE Sometimes, a mother notices her baby moves less before there are problems. Because of this, it is believed that fetal movement checking by the mother (kick counts) is a good way to check the baby before birth. There are different ways of doing this. Two good ways are:  The woman lies on her side and counts distinct (individual) fetal movements. A feeling of 10 distinct movements during the time you are awake is considered reassuring. When 10 movements are felt, you may stop counting.  Women are instructed to count fetal movements for 1 hour, three times per week. The count is good if, after one week, it equals or is over the woman's previously established baseline count. If the count is lower, further checking of your baby is needed. AFTER THE PROCEDURE You may resume your usual activities. HOME CARE INSTRUCTIONS   Follow your caregiver's advice and recommendations.  Be aware of your baby's movements. Are they normal, less than usual or more than usual?  Make and keep the rest of your prenatal appointments. SEEK MEDICAL CARE IF:   You develop a temperature of 100  F (37.8 C) or higher.  You have a bloody mucus discharge from the vagina (a bloody show). SEEK IMMEDIATE MEDICAL CARE IF:   You do not feel the baby move.  You think the baby's movements are too little or too many.  You develop contractions.  You develop vaginal bleeding.  You have belly (abdominal) pain.  You have leaking or a  gush of fluid from the vagina. Document Released: 01/13/2002 Document Revised: 04/17/2011 Document Reviewed: 05/18/2008 [facility] Patient Information 2014 [place], [place].

## 2013-02-09 NOTE — MAU Note (Signed)
Patient presents with complaint of decreased fetal movement since last night. 

## 2013-02-09 NOTE — MAU Provider Note (Signed)
History   33 yo G2P0101 at 3337 4/7 weeks presented after calling with a report of decreased FM since last night.  Denies leaking or bleeding.    Now aware of FM.  Patient Active Problem List   Diagnosis Date Noted  . Language barrier--Sudanese 01/05/2013  . H/O poor fetal growth--AC and HC lag, ? constitutional vs IUGR 01/05/2013  . Previous cesarean delivery, antepartum condition or complication--desires VBAC 01/05/2013  . GBS (group B Streptococcus carrier), +RV culture, currently pregnant 01/05/2013  . Rh negative state in antepartum period 01/05/2013  . Marginal insertion of umbilical cord 01/05/2013    HPI:  See above  OB History   Grav Para Term Preterm Abortions TAB SAB Ect Mult Living   2 1  1      1       Past Medical History  Diagnosis Date  . No pertinent past medical history   . Preterm delivery     Past Surgical History  Procedure Laterality Date  . No past surgeries    . Cesarean section  02/08/2011    Procedure: CESAREAN SECTION;  Surgeon: Caprice Beaverobert M Wein;  Location: [facility];  Service: Gynecology;  Laterality: N/A;    Family History  Problem Relation Age of Onset  . Hypertension Mother   . Asthma Father     History  Substance Use Topics  . Smoking status: Never Smoker   . Smokeless tobacco: Never Used  . Alcohol Use: No    Allergies: No Known Allergies  Prescriptions prior to admission  Medication Sig Dispense Refill  . Prenatal Vit-Fe Fumarate-FA (PRENATAL MULTIVITAMIN) TABS Take 1 tablet by mouth daily.         ROS:  None Physical Exam   Last menstrual period 05/22/2012.  Physical Exam Chest clear Heart RRR without mumur Abd gravid, NT Pelvic--deferred Ext WNL  FHR Category 1 for 1 1/2 hour tracing. UCs very occasional  ED Course  IUP at 37 4/7 weeks Reactive FHR Good FM now  [facility]: [facility].    Nigel BridgemanLATHAM, Willis Holquin CNM, [place] 02/09/2013 11:49 AM

## 2013-02-19 ENCOUNTER — Encounter (HOSPITAL_COMMUNITY): Payer: Self-pay | Admitting: General Practice

## 2013-02-19 ENCOUNTER — Inpatient Hospital Stay (HOSPITAL_COMMUNITY)
Admission: AD | Admit: 2013-02-19 | Discharge: 2013-02-23 | DRG: 775 | Disposition: A | Discharge status: Home / Self Care | Payer: Medicaid Other | Source: Ambulatory Visit | Attending: Obstetrics and Gynecology | Admitting: Obstetrics and Gynecology

## 2013-02-19 DIAGNOSIS — O365990 Maternal care for other known or suspected poor fetal growth, unspecified trimester, not applicable or unspecified: Secondary | ICD-10-CM | POA: Diagnosis present

## 2013-02-19 DIAGNOSIS — Z22330 Carrier of Group B streptococcus: Secondary | ICD-10-CM

## 2013-02-19 DIAGNOSIS — O1490 Unspecified pre-eclampsia, unspecified trimester: Secondary | ICD-10-CM | POA: Diagnosis not present

## 2013-02-19 DIAGNOSIS — O9989 Other specified diseases and conditions complicating pregnancy, childbirth and the puerperium: Secondary | ICD-10-CM

## 2013-02-19 DIAGNOSIS — O34219 Maternal care for unspecified type scar from previous cesarean delivery: Principal | ICD-10-CM | POA: Diagnosis not present

## 2013-02-19 DIAGNOSIS — O99892 Other specified diseases and conditions complicating childbirth: Secondary | ICD-10-CM | POA: Diagnosis present

## 2013-02-19 DIAGNOSIS — O4290 Premature rupture of membranes, unspecified as to length of time between rupture and onset of labor, unspecified weeks of gestation: Secondary | ICD-10-CM | POA: Diagnosis present

## 2013-02-19 DIAGNOSIS — O411290 Chorioamnionitis, unspecified trimester, not applicable or unspecified: Secondary | ICD-10-CM | POA: Diagnosis not present

## 2013-02-19 DIAGNOSIS — O411090 Infection of amniotic sac and membranes, unspecified, unspecified trimester, not applicable or unspecified: Secondary | ICD-10-CM | POA: Diagnosis present

## 2013-02-19 LAB — TYPE AND SCREEN
ABO/RH(D): A NEG
Antibody Screen: NEGATIVE

## 2013-02-19 LAB — CBC
HEMATOCRIT: 43.4 % (ref 36.0–46.0)
Hemoglobin: 14.8 g/dL (ref 12.0–15.0)
MCH: 31.1 pg (ref 26.0–34.0)
MCHC: 34.1 g/dL (ref 30.0–36.0)
MCV: 91.2 fL (ref 78.0–100.0)
Platelets: 233 10*3/uL (ref 150–400)
RBC: 4.76 MIL/uL (ref 3.87–5.11)
RDW: 13.7 % (ref 11.5–15.5)
WBC: 7.8 10*3/uL (ref 4.0–10.5)

## 2013-02-19 LAB — POCT FERN TEST: POCT Fern Test: NEGATIVE

## 2013-02-19 LAB — RPR: RPR Ser Ql: NONREACTIVE

## 2013-02-19 LAB — AMNISURE RUPTURE OF MEMBRANE (ROM) NOT AT ARMC: Amnisure ROM: POSITIVE

## 2013-02-19 MED ORDER — OXYTOCIN 40 UNITS IN LACTATED RINGERS INFUSION - SIMPLE MED
1.0000 m[IU]/min | INTRAVENOUS | Status: DC
  Administered 2013-02-19: 5 m[IU]/min via INTRAVENOUS
  Administered 2013-02-19: 3 m[IU]/min via INTRAVENOUS
  Administered 2013-02-19: 9 m[IU]/min via INTRAVENOUS
  Administered 2013-02-19 – 2013-02-20 (×2): 1 m[IU]/min via INTRAVENOUS
  Administered 2013-02-20: 5 m[IU]/min via INTRAVENOUS
  Administered 2013-02-20: 3 m[IU]/min via INTRAVENOUS

## 2013-02-19 MED ORDER — LACTATED RINGERS IV SOLN
500.0000 mL | INTRAVENOUS | Status: DC | PRN
  Administered 2013-02-20: 500 mL via INTRAVENOUS
  Administered 2013-02-20 (×3): 1000 mL via INTRAVENOUS

## 2013-02-19 MED ORDER — LACTATED RINGERS IV SOLN
INTRAVENOUS | Status: DC
  Administered 2013-02-19 – 2013-02-20 (×2): via INTRAVENOUS

## 2013-02-19 MED ORDER — IBUPROFEN 600 MG PO TABS
600.0000 mg | ORAL_TABLET | Freq: Four times a day (QID) | ORAL | Status: DC | PRN
  Administered 2013-02-21: 600 mg via ORAL
  Filled 2013-02-19: qty 1

## 2013-02-19 MED ORDER — ONDANSETRON HCL 4 MG/2ML IJ SOLN: 4.0000 mg | Freq: Four times a day (QID) | INTRAMUSCULAR | Status: DC | PRN

## 2013-02-19 MED ORDER — ACETAMINOPHEN 325 MG PO TABS: 650.0000 mg | ORAL_TABLET | ORAL | Status: DC | PRN

## 2013-02-19 MED ORDER — PENICILLIN G POTASSIUM 5000000 UNITS IJ SOLR
5.0000 10*6.[IU] | Freq: Once | INTRAVENOUS | Status: AC
  Administered 2013-02-19: 5 10*6.[IU] via INTRAVENOUS
  Filled 2013-02-19: qty 5

## 2013-02-19 MED ORDER — CITRIC ACID-SODIUM CITRATE 334-500 MG/5ML PO SOLN
30.0000 mL | ORAL | Status: DC | PRN
  Filled 2013-02-19: qty 15

## 2013-02-19 MED ORDER — OXYTOCIN 40 UNITS IN LACTATED RINGERS INFUSION - SIMPLE MED
62.5000 mL/h | INTRAVENOUS | Status: DC
  Filled 2013-02-19: qty 1000

## 2013-02-19 MED ORDER — BUTORPHANOL TARTRATE 1 MG/ML IJ SOLN: 1.0000 mg | INTRAMUSCULAR | Status: DC | PRN

## 2013-02-19 MED ORDER — OXYCODONE-ACETAMINOPHEN 5-325 MG PO TABS: 1.0000 | ORAL_TABLET | ORAL | Status: DC | PRN

## 2013-02-19 MED ORDER — LIDOCAINE HCL (PF) 1 % IJ SOLN
30.0000 mL | INTRAMUSCULAR | Status: AC | PRN
  Administered 2013-02-20: 30 mL via SUBCUTANEOUS
  Filled 2013-02-19 (×2): qty 30

## 2013-02-19 MED ORDER — OXYTOCIN BOLUS FROM INFUSION
500.0000 mL | INTRAVENOUS | Status: DC
  Administered 2013-02-20: 500 mL via INTRAVENOUS

## 2013-02-19 MED ORDER — PENICILLIN G POTASSIUM 5000000 UNITS IJ SOLR
2.5000 10*6.[IU] | INTRAVENOUS | Status: DC
  Administered 2013-02-19 – 2013-02-20 (×8): 2.5 10*6.[IU] via INTRAVENOUS
  Filled 2013-02-19 (×13): qty 2.5

## 2013-02-19 MED ORDER — TERBUTALINE SULFATE 1 MG/ML IJ SOLN
0.2500 mg | Freq: Once | INTRAMUSCULAR | Status: AC | PRN
Start: 2013-02-19 — End: 2013-02-19

## 2013-02-19 NOTE — Progress Notes (Signed)
Patient ID: Stacey Graves, female   DOB: 04-01-1980, 33 y.o.   MRN: [id number] Stacey Graves is a 33 y.o. G2P0101 at 2761w0d admitted for PROM   Subjective: Some ctx more painful, declines meds  Objective: BP 155/85  Pulse 65  Temp(Src) 98.3 F (36.8 C) (Oral)  Resp 18  Ht 5\' 1"  (1.549 m)  Wt 135 lb (61.236 kg)  BMI 25.52 kg/m2  LMP 05/22/2012     FHT:  Cat 1 UC:   toco 1-2   SVE:   Dilation: Fingertip Effacement (%): 50 Exam by:: Yazid Pop, Shelly CNM  External os 3-4cm, internal os still FT/50/-1   Assessment / Plan:  Labor: PROM, TOLAC, on pitocin Preeclampsia:  no s/s Fetal Wellbeing:  Category I Pain Control:  declines at present Anticipated MOD:  undetermined  GBS pos, rcv'd PCN PROM x 16hrs, no sx's chorio Pitocin at 9mu, unable to increase due to frequency of ctx Will continue and titrate when able Pain meds prn, options rv'd    Update physician PRN   Malissa HippoLILLARD,Jahki Witham M 02/19/2013, 10:22 PM

## 2013-02-19 NOTE — H&P (Addendum)
Stacey Graves is a 33 y.o. G2P1 female presenting at 2239wks with h/o prior c/s desiring TOL/VBAC presents c/o leaking fluid 3 times. She feels ctxs q2615min. Denies VB or bleeding and reports good FM. Pt looks comfortable and apparently doesn't feel all the ctxs that are showing on the toco. . History OB History   Grav Para Term Preterm Abortions TAB SAB Ect Mult Living   2 1  1      1      Past Medical History  Diagnosis Date  . No pertinent past medical history   . Preterm delivery   . Medical history non-contributory    Past Surgical History  Procedure Laterality Date  . Cesarean section  02/08/2011    Procedure: CESAREAN SECTION;  Surgeon: Caprice Beaverobert M Wein;  Location: [facility];  Service: Gynecology;  Laterality: N/A;   Family History: family history includes Asthma in her father; Hypertension in her mother. Social History:  reports that she has never smoked. She has never used smokeless tobacco. She reports that she does not drink alcohol or use illicit drugs.   Prenatal Transfer Tool  Maternal Diabetes: No Genetic Screening: Declined/not done Maternal Ultrasounds/Referrals: Abnormal:  Findings:   Other:2V cord, marginal cord insertion and SGA Fetal Ultrasounds or other Referrals:  None Maternal Substance Abuse:  No Significant Maternal Medications:  None Significant Maternal Lab Results:  Lab values include: Group B Strep positive Other Comments:  None  ROS Non-contributory  Dilation: Closed Effacement (%): Thick Exam by:: dr Su Hiltroberts Blood pressure 123/83, pulse 90, temperature 98 F (36.7 C), temperature source Oral, resp. rate 18, height 5\' 1"  (1.549 m), weight 61.236 kg (135 lb), last menstrual period 05/22/2012. Exam Physical Exam   Lungs CTA CV RRR Abd gravid, NT Ext no calf tenderness Fern positive Amnisure pending FHT cat 1 Toco q122min  Prenatal labs: ABO, Rh:   A neg Antibody:  neg Rubella:  immune RPR:   NR HBsAg:   neg HIV:   NR GBS:    positive  Assessment/Plan: P1 at 39 wks with SROM contracting regularly but not uncomfortable yet.  Will start PCN for GBS positive and continue to observe.  If not changing after 6hrs, will augment with pitocin.  Fetal status is reassuring.  VBAC consent signed in office.  Reyonna Haack Y 02/19/2013, 9:14 AM

## 2013-02-19 NOTE — Progress Notes (Signed)
Patient ID: Stacey Graves, female   DOB: 1980/11/01, 33 y.o.   MRN: [id number] Stacey Graves is a 33 y.o. G2P0101 at 7634w0d admitted for PROM  Subjective: Feels some pain w ctx, declines any meds  Objective: BP 155/85  Pulse 65  Temp(Src) 98.3 F (36.8 C) (Oral)  Resp 18  Ht 5\' 1"  (1.549 m)  Wt 135 lb (61.236 kg)  BMI 25.52 kg/m2  LMP 05/22/2012     FHT:  Cat 1 UC:   toco 1-2  SVE:   Exam deferred    Assessment / Plan:  Labor: PROM, TOLAC Preeclampsia:  no s/s Fetal Wellbeing:  Category I Pain Control:  declines at present, rv'd options Anticipated MOD:  undetermined  GBS pos, rcv'd PCN  PROM  TOLAC Continue pitocin   Update physician PRN   Malissa HippoLILLARD,Adrienne Delay M 02/19/2013, 10:17 PM

## 2013-02-19 NOTE — MAU Note (Signed)
Pt presents with complaints of large gush of fluid this morning. Denies any bleeding. States baby is active.

## 2013-02-19 NOTE — MAU Provider Note (Signed)
  History     CSN: [id number]  Arrival date and time: 02/19/13 [id number]   None     Chief Complaint  Patient presents with  . Rupture of Membranes   HPI G2P1 at 39wks with h/o prior c/s desiring TOL/VBAC presents c/o leaking fluid 3 times.  She feels ctxs q4015min.  Denies VB or bleeding and reports good FM.  Pt looks comfortable and apparently doesn't feel all the ctxs that are showing on the toco.  OB History   Grav Para Term Preterm Abortions TAB SAB Ect Mult Living   2 1  1      1       Past Medical History  Diagnosis Date  . No pertinent past medical history   . Preterm delivery   . Medical history non-contributory     Past Surgical History  Procedure Laterality Date  . Cesarean section  02/08/2011    Procedure: CESAREAN SECTION;  Surgeon: Caprice Beaverobert M Wein;  Location: [facility];  Service: Gynecology;  Laterality: N/A;    Family History  Problem Relation Age of Onset  . Hypertension Mother   . Asthma Father     History  Substance Use Topics  . Smoking status: Never Smoker   . Smokeless tobacco: Never Used  . Alcohol Use: No    Allergies: No Known Allergies  Prescriptions prior to admission  Medication Sig Dispense Refill  . Prenatal Vit-Fe Fumarate-FA (PRENATAL MULTIVITAMIN) TABS Take 1 tablet by mouth daily.         ROS Non-contributory  Physical Exam   Blood pressure 123/83, pulse 90, temperature 98 F (36.7 C), temperature source Oral, resp. rate 18, height 5\' 1"  (1.549 m), weight 61.236 kg (135 lb), last menstrual period 05/22/2012.  Physical Exam  VE closed/long FHT140s, mod variability, occas accels, no decels - cat 1 Toco q 2min Spec small amt of fluid in vagina  MAU Course  Procedures  Amnisure pending   Assessment and Plan  P1 at 39wks being evaluated for ROM and Labor.  Fetal status is reassuring.  If + SROM or Labor, will admit.  GBS is positive with NKDA.  Pt signed VBAC consent in the office.  Keylen Eckenrode Y 02/19/2013, 9:00 AM

## 2013-02-19 NOTE — Progress Notes (Signed)
Stacey Graves is a 33 y.o. G2P0101 at 2244w0d   Subjective: Ctxs are the same per pt.  Pt tearful because she hasn't seen her mom in 6835yrs otherwise she is ok.  Objective: BP 105/63  Pulse 98  Temp(Src) 98.1 F (36.7 C) (Oral)  Resp 20  Ht 5\' 1"  (1.549 m)  Wt 61.236 kg (135 lb)  BMI 25.52 kg/m2  LMP 05/22/2012      FHT:  FHR: 150s bpm, variability: moderate,  occas accelerations:  Present,  decelerations:  Absent UC:   every 3 minutes SVE:   Dilation: Fingertip Effacement (%): Thick Exam by:: Ilah Boule  Labs: Lab Results  Component Value Date   WBC 7.8 02/19/2013   HGB 14.8 02/19/2013   HCT 43.4 02/19/2013   MCV 91.2 02/19/2013   PLT 233 02/19/2013    Assessment / Plan: SROM  Labor: Augmenting labor Preeclampsia:  no signs or symptoms of toxicity Fetal Wellbeing:  Category I Pain Control:  Labor support without medications I/D:  GBS + on PCN Anticipated MOD:  NSVD  Matvey Llanas Y 02/19/2013, 6:34 PM

## 2013-02-20 ENCOUNTER — Inpatient Hospital Stay (HOSPITAL_COMMUNITY): Payer: Medicaid Other | Admitting: Anesthesiology

## 2013-02-20 ENCOUNTER — Encounter (HOSPITAL_COMMUNITY): Payer: Self-pay | Admitting: Anesthesiology

## 2013-02-20 ENCOUNTER — Encounter (HOSPITAL_COMMUNITY): Payer: Medicaid Other | Admitting: Anesthesiology

## 2013-02-20 LAB — COMPREHENSIVE METABOLIC PANEL
ALBUMIN: 2.6 g/dL — AB (ref 3.5–5.2)
ALT: 11 U/L (ref 0–35)
AST: 19 U/L (ref 0–37)
Alkaline Phosphatase: 166 U/L — ABNORMAL HIGH (ref 39–117)
BUN: 6 mg/dL (ref 6–23)
CO2: 21 mEq/L (ref 19–32)
Calcium: 8.9 mg/dL (ref 8.4–10.5)
Chloride: 105 mEq/L (ref 96–112)
Creatinine, Ser: 0.75 mg/dL (ref 0.50–1.10)
GFR calc non Af Amer: >90 mL/min (ref >90)
GLUCOSE: 74 mg/dL (ref 70–99)
Potassium: 4.6 mEq/L (ref 3.7–5.3)
Sodium: 141 mEq/L (ref 137–147)
TOTAL PROTEIN: 6 g/dL (ref 6.0–8.3)
Total Bilirubin: 0.3 mg/dL (ref 0.3–1.2)

## 2013-02-20 LAB — PROTEIN / CREATININE RATIO, URINE
Creatinine, Urine: 13.58 mg/dL
PROTEIN CREATININE RATIO: 0.29 — AB (ref 0.00–0.15)
Total Protein, Urine: 4 mg/dL

## 2013-02-20 LAB — CBC
HEMATOCRIT: 39.3 % (ref 36.0–46.0)
HEMOGLOBIN: 13.6 g/dL (ref 12.0–15.0)
MCH: 31.4 pg (ref 26.0–34.0)
MCHC: 34.6 g/dL (ref 30.0–36.0)
MCV: 90.8 fL (ref 78.0–100.0)
Platelets: 223 10*3/uL (ref 150–400)
RBC: 4.33 MIL/uL (ref 3.87–5.11)
RDW: 13.3 % (ref 11.5–15.5)
WBC: 6.7 10*3/uL (ref 4.0–10.5)

## 2013-02-20 LAB — URIC ACID: Uric Acid, Serum: 6.8 mg/dL (ref 2.4–7.0)

## 2013-02-20 LAB — LACTATE DEHYDROGENASE: LDH: 187 U/L (ref 94–250)

## 2013-02-20 MED ORDER — MISOPROSTOL 200 MCG PO TABS
1000.0000 ug | ORAL_TABLET | Freq: Once | ORAL | Status: AC
  Administered 2013-02-21: 1000 ug via VAGINAL

## 2013-02-20 MED ORDER — PHENYLEPHRINE 40 MCG/ML (10ML) SYRINGE FOR IV PUSH (FOR BLOOD PRESSURE SUPPORT)
80.0000 ug | PREFILLED_SYRINGE | INTRAVENOUS | Status: DC | PRN
  Filled 2013-02-20: qty 2
  Filled 2013-02-20: qty 10

## 2013-02-20 MED ORDER — MISOPROSTOL 200 MCG PO TABS
ORAL_TABLET | ORAL | Status: AC
  Administered 2013-02-21: 1000 ug via VAGINAL
  Filled 2013-02-20: qty 5

## 2013-02-20 MED ORDER — DIPHENHYDRAMINE HCL 50 MG/ML IJ SOLN: 12.5000 mg | INTRAMUSCULAR | Status: DC | PRN

## 2013-02-20 MED ORDER — LIDOCAINE HCL (PF) 1 % IJ SOLN
INTRAMUSCULAR | Status: DC | PRN
  Administered 2013-02-20 (×2): 4 mL

## 2013-02-20 MED ORDER — LACTATED RINGERS IV SOLN
INTRAVENOUS | Status: DC
  Administered 2013-02-20: 23:00:00 via INTRAUTERINE
  Administered 2013-02-20: 250 mL via INTRAUTERINE

## 2013-02-20 MED ORDER — EPHEDRINE 5 MG/ML INJ
10.0000 mg | INTRAVENOUS | Status: DC | PRN
  Filled 2013-02-20: qty 2
  Filled 2013-02-20: qty 4

## 2013-02-20 MED ORDER — LACTATED RINGERS IV SOLN: 500.0000 mL | Freq: Once | INTRAVENOUS | Status: DC

## 2013-02-20 MED ORDER — FENTANYL 2.5 MCG/ML BUPIVACAINE 1/10 % EPIDURAL INFUSION (WH - ANES)
14.0000 mL/h | INTRAMUSCULAR | Status: DC | PRN
  Administered 2013-02-20: 14 mL/h via EPIDURAL
  Filled 2013-02-20 (×2): qty 125

## 2013-02-20 MED ORDER — PHENYLEPHRINE 40 MCG/ML (10ML) SYRINGE FOR IV PUSH (FOR BLOOD PRESSURE SUPPORT)
80.0000 ug | PREFILLED_SYRINGE | INTRAVENOUS | Status: DC | PRN
  Administered 2013-02-20: 14:00:00 via INTRAVENOUS
  Filled 2013-02-20: qty 2

## 2013-02-20 MED ORDER — FENTANYL 2.5 MCG/ML BUPIVACAINE 1/10 % EPIDURAL INFUSION (WH - ANES)
INTRAMUSCULAR | Status: DC | PRN
  Administered 2013-02-20: 12 mL/h via EPIDURAL

## 2013-02-20 MED ORDER — EPHEDRINE 5 MG/ML INJ
10.0000 mg | INTRAVENOUS | Status: DC | PRN
  Filled 2013-02-20: qty 2

## 2013-02-20 MED ORDER — SODIUM CHLORIDE 0.9 % IV SOLN
3.0000 g | Freq: Once | INTRAVENOUS | Status: AC
  Administered 2013-02-20: 3 g via INTRAVENOUS
  Filled 2013-02-20: qty 3

## 2013-02-20 NOTE — Progress Notes (Signed)
Stacey Graves is a 33 y.o. G2P0101 at 4932w1d   Subjective: Still says ctxs are not really painful  Objective: BP 162/94  Pulse 56  Temp(Src) 98.1 F (36.7 C) (Oral)  Resp 18  Ht 5\' 1"  (1.549 m)  Wt 61.236 kg (135 lb)  BMI 25.52 kg/m2  LMP 05/22/2012      FHT:  FHR: 140s bpm, variability: moderate,  accelerations:  Present,  decelerations:  Absent UC:   q 3-285min SVE:   Dilation: Fingertip Effacement (%): 50 Exam by:: lillard, Shelly CNM  Labs: Lab Results  Component Value Date   WBC 7.8 02/19/2013   HGB 14.8 02/19/2013   HCT 43.4 02/19/2013   MCV 91.2 02/19/2013   PLT 233 02/19/2013    Assessment / Plan: Augmenting labor with pitocin s/p SROM yest at 5am  Labor: will place foley.  Pt offered c/s vs foley to try to help facilitate labor.  R/B/A discussed including increased risk for infection with foley.  Pt wants foley and not c/s. Preeclampsia:  no signs or symptoms of toxicity Fetal Wellbeing:  Category I Pain Control:  Labor support without medications I/D:  GBS + on PCN, no s/sxs of toxicity Anticipated MOD:  NSVD  Shelbie Franken Y 02/20/2013, 11:20 AM

## 2013-02-20 NOTE — Progress Notes (Signed)
Patient ID: Stacey Graves, female   DOB: September 01, 1980, 33 y.o.   MRN: [id number] Stacey Graves is a 33 y.o. G2P0101 at 2562w1d admitted for PROM  Subjective: Sleeping, has not needed pain meds   Objective: BP 143/73  Pulse 57  Temp(Src) 98 F (36.7 C) (Oral)  Resp 18  Ht 5\' 1"  (1.549 m)  Wt 135 lb (61.236 kg)  BMI 25.52 kg/m2  LMP 05/22/2012     FHT:  Cat 1 UC:   toco not tracing well, ctx appear to be irreg   SVE:   Dilation: Fingertip Effacement (%): 50 Exam by:: Floy Riegler, Shelly CNM  Exam deferred at present   Assessment / Plan:  Labor: PROM x24hrs  Preeclampsia:  no s/s Fetal Wellbeing:  Category I Pain Control:  none needed Anticipated MOD:  undetermined  GBS pos, rcv'd PCN TOLAC PROM, not in active labor Continue pitocin titration, consider foley bulb/IUPC if no progress   Update physician PRN   Malissa HippoLILLARD,Abdur Hoglund M 02/20/2013, 5:47 AM

## 2013-02-20 NOTE — Progress Notes (Signed)
Discussed C/S with pt and FOB.  [facility],  Discussed risks of possible repeat C/S.  Answered questions for pt and FOB.  Dr Su Hiltoberts remains on unit.

## 2013-02-20 NOTE — Progress Notes (Signed)
Patient ID: Stacey Graves, female   DOB: 08/05/1980, 33 y.o.   MRN: [id number] Stacey Graves is a 33 y.o. G2P0101 at 2166w1d admitted for PROM   Subjective: Overall comfortable w epidural, feeling some pressure now   Objective: BP 159/95  Pulse 87  Temp(Src) 98.9 F (37.2 C) (Oral)  Resp 18  Ht 5\' 1"  (1.549 m)  Wt 135 lb (61.236 kg)  BMI 25.52 kg/m2  SpO2 100%  LMP 05/22/2012      FHT:  Cat 1, some periods of decreased variability, 10x10 w scalp stim, decels have resolved  UC:   toco 2-3   SVE:  10/+1/+2   Assessment / Plan:  Labor: 2nd stage Preeclampsia:  intake and ouput balanced, labs stable and no sx's, denies HA/N/V/RUQ pain, no edema, nl reflexes Fetal Wellbeing:  Category I Pain Control:  Epidural Anticipated MOD:  VBAC  TOLAC, prolonged IOL  GBS pos, has rcv'd PCN since admission,  Prolonged rupture of membranes, no sx's chorio, afebrile Will labor down until feeling stronger urge to push     Some elevated BP's, PIH labs around noon were normal, will CTO closely   Update physician PRN   Malissa HippoLILLARD,Morad Tal M 02/20/2013, 8:29 PM

## 2013-02-20 NOTE — Anesthesia Preprocedure Evaluation (Signed)
Anesthesia Evaluation  Patient identified by MRN, date of birth, ID band Patient awake    Reviewed: Allergy & Precautions, H&P , Patient's Chart, lab work & pertinent test results  Airway Mallampati: III TM Distance: >3 FB     Dental no notable dental hx. (+) Teeth Intact   Pulmonary neg pulmonary ROS,  breath sounds clear to auscultation  Pulmonary exam normal       Cardiovascular negative cardio ROS  Rhythm:Regular Rate:Normal     Neuro/Psych negative neurological ROS  negative psych ROS   GI/Hepatic negative GI ROS, Neg liver ROS,   Endo/Other  negative endocrine ROS  Renal/GU negative Renal ROS  negative genitourinary   Musculoskeletal   Abdominal   Peds  Hematology negative hematology ROS (+)   Anesthesia Other Findings   Reproductive/Obstetrics (+) Pregnancy Previous C/Section                           Anesthesia Physical Anesthesia Plan  ASA: II  Anesthesia Plan: Epidural   Post-op Pain Management:    Induction:   Airway Management Planned: Natural Airway  Additional Equipment:   Intra-op Plan:   Post-operative Plan:   Informed Consent: I have reviewed the patients History and Physical, chart, labs and discussed the procedure including the risks, benefits and alternatives for the proposed anesthesia with the patient or authorized representative who has indicated his/her understanding and acceptance.     Plan Discussed with: Anesthesiologist  Anesthesia Plan Comments:         Anesthesia Quick Evaluation

## 2013-02-20 NOTE — Anesthesia Procedure Notes (Signed)
Epidural Patient location during procedure: OB Start time: 02/20/2013 1:28 PM  Staffing Anesthesiologist: Osa Campoli A. Performed by: anesthesiologist   Preanesthetic Checklist Completed: patient identified, site marked, surgical consent, pre-op evaluation, timeout performed, IV checked, risks and benefits discussed and monitors and equipment checked  Epidural Patient position: sitting Prep: site prepped and draped and DuraPrep Patient monitoring: continuous pulse ox and blood pressure Approach: midline Injection technique: LOR air  Needle:  Needle type: Tuohy  Needle gauge: 17 G Needle length: 9 cm and 9 Needle insertion depth: 4 cm Catheter type: closed end flexible Catheter size: 19 Gauge Catheter at skin depth: 9 cm Test dose: negative and Other  Assessment Events: blood not aspirated, injection not painful, no injection resistance, negative IV test and no paresthesia  Additional Notes Patient identified. Risks and benefits discussed including failed block, incomplete  Pain control, post dural puncture headache, nerve damage, paralysis, blood pressure Changes, nausea, vomiting, reactions to medications-both toxic and allergic and post Partum back pain. All questions were answered. Patient expressed understanding and wished to proceed. Sterile technique was used throughout procedure. Epidural site was Dressed with sterile barrier dressing. No paresthesias, signs of intravascular injection Or signs of intrathecal spread were encountered.  Patient was more comfortable after the epidural was dosed. Please see RN's note for documentation of vital signs and FHR which are stable.

## 2013-02-20 NOTE — Progress Notes (Signed)
Stacey Graves is a 33 y.o. G2P0101 at 9153w1d  Subjective: Getting comfortable s/p epidural.  Called  RN secondary to decel to 50s after the epidural.  Pt denies any sxs but BPs also getting higher.  Objective: BP 132/72  Pulse 61  Temp(Src) 98.8 F (37.1 C) (Oral)  Resp 18  Ht 5\' 1"  (1.549 m)  Wt 61.236 kg (135 lb)  BMI 25.52 kg/m2  SpO2 100%  LMP 05/22/2012      FHT:  Cat 2 UC:   q1-2 SVE:   Dilation: 2 Effacement (%): 80 Station: -2 Exam by:: Dr Su Hiltoberts  Labs: Lab Results  Component Value Date   WBC 6.7 02/20/2013   HGB 13.6 02/20/2013   HCT 39.3 02/20/2013   MCV 90.8 02/20/2013   PLT 223 02/20/2013    Assessment / Plan: Augmentation  Labor: Pt informed remote from delivery.  I discussed tracing is back up now and once baby fully recovers will restart pitocin.  It was held because of decel along with oxygen and fluids given.  I presented options to pt and she continued to want to try for vaginal delivery and declined c/s. Preeclampsia:  BPs increasing, will send ghtn labs for w/u and urine from foley. Fetal Wellbeing:  Category II Pain Control:  Epidural I/D:  GBS + on PCN and currently no s/sxs of chorio but pt has been ruptured well over 24hrs. Anticipated MOD:  NSVD/VBAC  Shanna Strength Y 02/20/2013, 4:12 PM

## 2013-02-21 ENCOUNTER — Encounter (HOSPITAL_COMMUNITY): Payer: Self-pay | Admitting: *Deleted

## 2013-02-21 DIAGNOSIS — O1490 Unspecified pre-eclampsia, unspecified trimester: Secondary | ICD-10-CM | POA: Diagnosis not present

## 2013-02-21 DIAGNOSIS — O411290 Chorioamnionitis, unspecified trimester, not applicable or unspecified: Secondary | ICD-10-CM | POA: Diagnosis not present

## 2013-02-21 DIAGNOSIS — O34219 Maternal care for unspecified type scar from previous cesarean delivery: Secondary | ICD-10-CM | POA: Diagnosis not present

## 2013-02-21 LAB — CBC
HEMATOCRIT: 39.1 % (ref 36.0–46.0)
HEMATOCRIT: 36 % (ref 36.0–46.0)
HEMOGLOBIN: 13.7 g/dL (ref 12.0–15.0)
Hemoglobin: 12.5 g/dL (ref 12.0–15.0)
MCH: 31.7 pg (ref 26.0–34.0)
MCH: 31.2 pg (ref 26.0–34.0)
MCHC: 35 g/dL (ref 30.0–36.0)
MCHC: 34.7 g/dL (ref 30.0–36.0)
MCV: 90.5 fL (ref 78.0–100.0)
MCV: 89.8 fL (ref 78.0–100.0)
Platelets: 182 10*3/uL (ref 150–400)
Platelets: 161 10*3/uL (ref 150–400)
RBC: 4.32 MIL/uL (ref 3.87–5.11)
RBC: 4.01 MIL/uL (ref 3.87–5.11)
RDW: 13.3 % (ref 11.5–15.5)
RDW: 13.3 % (ref 11.5–15.5)
WBC: 13.7 10*3/uL — AB (ref 4.0–10.5)
WBC: 14.6 10*3/uL — AB (ref 4.0–10.5)

## 2013-02-21 LAB — COMPREHENSIVE METABOLIC PANEL
ALT: 17 U/L (ref 0–35)
ALT: 14 U/L (ref 0–35)
AST: 43 U/L — ABNORMAL HIGH (ref 0–37)
AST: 40 U/L — AB (ref 0–37)
Albumin: 2.1 g/dL — ABNORMAL LOW (ref 3.5–5.2)
Albumin: 1.9 g/dL — ABNORMAL LOW (ref 3.5–5.2)
Alkaline Phosphatase: 159 U/L — ABNORMAL HIGH (ref 39–117)
Alkaline Phosphatase: 131 U/L — ABNORMAL HIGH (ref 39–117)
BILIRUBIN TOTAL: 0.5 mg/dL (ref 0.3–1.2)
BUN: 9 mg/dL (ref 6–23)
BUN: 8 mg/dL (ref 6–23)
CHLORIDE: 104 meq/L (ref 96–112)
CHLORIDE: 104 meq/L (ref 96–112)
CO2: 21 mEq/L (ref 19–32)
CO2: 19 meq/L (ref 19–32)
CREATININE: 1.12 mg/dL — AB (ref 0.50–1.10)
CREATININE: 0.95 mg/dL (ref 0.50–1.10)
Calcium: 8.7 mg/dL (ref 8.4–10.5)
Calcium: 8.4 mg/dL (ref 8.4–10.5)
GFR calc non Af Amer: 79 mL/min — ABNORMAL LOW (ref >90)
GFR, EST AFRICAN AMERICAN: 75 mL/min — AB (ref >90)
GFR, EST NON AFRICAN AMERICAN: 65 mL/min — AB (ref >90)
GLUCOSE: 109 mg/dL — AB (ref 70–99)
GLUCOSE: 105 mg/dL — AB (ref 70–99)
Potassium: 4.9 mEq/L (ref 3.7–5.3)
Potassium: 3.7 mEq/L (ref 3.7–5.3)
Sodium: 138 mEq/L (ref 137–147)
Sodium: 137 mEq/L (ref 137–147)
Total Bilirubin: 0.5 mg/dL (ref 0.3–1.2)
Total Protein: 5.6 g/dL — ABNORMAL LOW (ref 6.0–8.3)
Total Protein: 5.1 g/dL — ABNORMAL LOW (ref 6.0–8.3)

## 2013-02-21 LAB — CBC WITH DIFFERENTIAL/PLATELET
BASOS ABS: 0 10*3/uL (ref 0.0–0.1)
Basophils Relative: 0 % (ref 0–1)
EOS PCT: 0 % (ref 0–5)
Eosinophils Absolute: 0 10*3/uL (ref 0.0–0.7)
HEMATOCRIT: 36.7 % (ref 36.0–46.0)
Hemoglobin: 12.8 g/dL (ref 12.0–15.0)
Lymphocytes Relative: 16 % (ref 12–46)
Lymphs Abs: 2.2 10*3/uL (ref 0.7–4.0)
MCH: 31.2 pg (ref 26.0–34.0)
MCHC: 34.9 g/dL (ref 30.0–36.0)
MCV: 89.5 fL (ref 78.0–100.0)
MONO ABS: 1.6 10*3/uL — AB (ref 0.1–1.0)
Monocytes Relative: 11 % (ref 3–12)
Neutro Abs: 10 10*3/uL — ABNORMAL HIGH (ref 1.7–7.7)
Neutrophils Relative %: 73 % (ref 43–77)
PLATELETS: 180 10*3/uL (ref 150–400)
RBC: 4.1 MIL/uL (ref 3.87–5.11)
RDW: 13.3 % (ref 11.5–15.5)
WBC: 13.7 10*3/uL — ABNORMAL HIGH (ref 4.0–10.5)

## 2013-02-21 LAB — MAGNESIUM: Magnesium: 4.5 mg/dL — ABNORMAL HIGH (ref 1.5–2.5)

## 2013-02-21 LAB — PROTEIN / CREATININE RATIO, URINE
Creatinine, Urine: 57.86 mg/dL
Protein Creatinine Ratio: 0.76 — ABNORMAL HIGH (ref 0.00–0.15)
Total Protein, Urine: 44.1 mg/dL

## 2013-02-21 LAB — LACTATE DEHYDROGENASE
LDH: 465 U/L — ABNORMAL HIGH (ref 94–250)
LDH: 399 U/L — ABNORMAL HIGH (ref 94–250)

## 2013-02-21 LAB — MRSA PCR SCREENING: MRSA by PCR: NEGATIVE

## 2013-02-21 LAB — URIC ACID
URIC ACID, SERUM: 8.1 mg/dL — AB (ref 2.4–7.0)
URIC ACID, SERUM: 7.4 mg/dL — AB (ref 2.4–7.0)

## 2013-02-21 MED ORDER — SENNOSIDES-DOCUSATE SODIUM 8.6-50 MG PO TABS
2.0000 | ORAL_TABLET | ORAL | Status: DC
  Administered 2013-02-21: 2 via ORAL
  Filled 2013-02-21: qty 2

## 2013-02-21 MED ORDER — WITCH HAZEL-GLYCERIN EX PADS: 1.0000 "application " | MEDICATED_PAD | CUTANEOUS | Status: DC | PRN

## 2013-02-21 MED ORDER — ONDANSETRON HCL 4 MG/2ML IJ SOLN: 4.0000 mg | INTRAMUSCULAR | Status: DC | PRN

## 2013-02-21 MED ORDER — MAGNESIUM SULFATE 40 G IN LACTATED RINGERS - SIMPLE
2.0000 g/h | INTRAVENOUS | Status: DC
  Filled 2013-02-21: qty 500

## 2013-02-21 MED ORDER — MEDROXYPROGESTERONE ACETATE 150 MG/ML IM SUSP: 150.0000 mg | INTRAMUSCULAR | Status: DC | PRN

## 2013-02-21 MED ORDER — FLEET ENEMA 7-19 GM/118ML RE ENEM: 1.0000 | ENEMA | Freq: Every day | RECTAL | Status: DC | PRN

## 2013-02-21 MED ORDER — OXYCODONE-ACETAMINOPHEN 5-325 MG PO TABS: 1.0000 | ORAL_TABLET | ORAL | Status: DC | PRN

## 2013-02-21 MED ORDER — MAGNESIUM SULFATE BOLUS VIA INFUSION
4.0000 g | Freq: Once | INTRAVENOUS | Status: AC
  Administered 2013-02-21: 4 g via INTRAVENOUS
  Filled 2013-02-21: qty 500

## 2013-02-21 MED ORDER — AMPICILLIN-SULBACTAM SODIUM 3 (2-1) G IJ SOLR
3.0000 g | Freq: Four times a day (QID) | INTRAMUSCULAR | Status: AC
  Administered 2013-02-21 (×4): 3 g via INTRAVENOUS
  Filled 2013-02-21 (×4): qty 3

## 2013-02-21 MED ORDER — ACETAMINOPHEN 325 MG PO TABS
650.0000 mg | ORAL_TABLET | Freq: Four times a day (QID) | ORAL | Status: DC | PRN
Start: 2013-02-21 — End: 2013-02-23
  Filled 2013-02-21: qty 2

## 2013-02-21 MED ORDER — MAGNESIUM SULFATE 40 MG/ML IJ SOLN: 4.0000 g | Freq: Once | INTRAMUSCULAR | Status: DC

## 2013-02-21 MED ORDER — LABETALOL HCL 5 MG/ML IV SOLN
10.0000 mg | INTRAVENOUS | Status: DC | PRN
  Filled 2013-02-21: qty 8

## 2013-02-21 MED ORDER — ONDANSETRON HCL 4 MG PO TABS: 4.0000 mg | ORAL_TABLET | ORAL | Status: DC | PRN

## 2013-02-21 MED ORDER — ZOLPIDEM TARTRATE 5 MG PO TABS: 5.0000 mg | ORAL_TABLET | Freq: Every evening | ORAL | Status: DC | PRN

## 2013-02-21 MED ORDER — LANOLIN HYDROUS EX OINT: TOPICAL_OINTMENT | CUTANEOUS | Status: DC | PRN

## 2013-02-21 MED ORDER — LACTATED RINGERS IV SOLN
INTRAVENOUS | Status: DC
  Administered 2013-02-21 – 2013-02-22 (×2): via INTRAVENOUS

## 2013-02-21 MED ORDER — MEASLES, MUMPS & RUBELLA VAC ~~LOC~~ INJ: 0.5000 mL | INJECTION | Freq: Once | SUBCUTANEOUS | Status: DC

## 2013-02-21 MED ORDER — DIPHENHYDRAMINE HCL 25 MG PO CAPS: 25.0000 mg | ORAL_CAPSULE | Freq: Four times a day (QID) | ORAL | Status: DC | PRN

## 2013-02-21 MED ORDER — LACTATED RINGERS IV SOLN
INTRAVENOUS | Status: DC
  Administered 2013-02-21: 1 mL via INTRAVENOUS

## 2013-02-21 MED ORDER — TETANUS-DIPHTH-ACELL PERTUSSIS 5-2.5-18.5 LF-MCG/0.5 IM SUSP
0.5000 mL | Freq: Once | INTRAMUSCULAR | Status: AC
  Administered 2013-02-22: 0.5 mL via INTRAMUSCULAR
  Filled 2013-02-21 (×2): qty 0.5

## 2013-02-21 MED ORDER — SIMETHICONE 80 MG PO CHEW: 80.0000 mg | CHEWABLE_TABLET | ORAL | Status: DC | PRN

## 2013-02-21 MED ORDER — MAGNESIUM SULFATE 40 G IN LACTATED RINGERS - SIMPLE: 2.0000 g/h | Freq: Once | INTRAVENOUS | Status: DC

## 2013-02-21 MED ORDER — BENZOCAINE-MENTHOL 20-0.5 % EX AERO
1.0000 "application " | INHALATION_SPRAY | CUTANEOUS | Status: DC | PRN
  Administered 2013-02-21 – 2013-02-22 (×2): 1 via TOPICAL
  Filled 2013-02-21 (×2): qty 56

## 2013-02-21 MED ORDER — DIBUCAINE 1 % RE OINT: 1.0000 "application " | TOPICAL_OINTMENT | RECTAL | Status: DC | PRN

## 2013-02-21 MED ORDER — PRENATAL MULTIVITAMIN CH
1.0000 | ORAL_TABLET | Freq: Every day | ORAL | Status: DC
  Administered 2013-02-21 – 2013-02-23 (×3): 1 via ORAL
  Filled 2013-02-21 (×3): qty 1

## 2013-02-21 MED ORDER — BISACODYL 10 MG RE SUPP: 10.0000 mg | Freq: Every day | RECTAL | Status: DC | PRN

## 2013-02-21 MED ORDER — IBUPROFEN 600 MG PO TABS
600.0000 mg | ORAL_TABLET | Freq: Four times a day (QID) | ORAL | Status: DC
  Administered 2013-02-21 – 2013-02-23 (×10): 600 mg via ORAL
  Filled 2013-02-21 (×10): qty 1

## 2013-02-21 NOTE — Progress Notes (Addendum)
Post Partum Day 1 Subjective: no complaints, up ad lib, voiding and tolerating PO.  Breast feeding.  Small BM x 2  Objective: Blood pressure 134/84, pulse 76, temperature 97.8 F (36.6 C), temperature source Oral, resp. rate 18, height 5\' 1"  (1.549 m), weight 61.78 kg (136 lb 3.2 oz), last menstrual period 05/22/2012, SpO2 99.00%, unknown if currently breastfeeding. UOP 1400 cc/9hrs (+ one that was not measured)  Physical Exam:  General: alert and no distress Lochia: appropriate Uterine Fundus: firm DVT Evaluation: No evidence of DVT seen on physical exam.   Recent Labs  02/21/13 0950 02/21/13 1204  HGB 12.8 12.5  HCT 36.7 36.0    Assessment/Plan: Breastfeeding Routine PP care Mg for Preeclampsia - BPs are under control Good UOP Plan to d/c mg in am Pt afebrile on unasyn   LOS: 2 days   Allura Doepke Y 02/21/2013, 4:25 PM

## 2013-02-21 NOTE — Lactation Note (Signed)
This note was copied from the chart of Stacey Graves. Lactation Consultation Note    Initial consult with this mom fo a term baby, now 14 hours post partum. This is mom's second baby, and she is an experienced breast feeder. She reports breast feeding going well, but says latching and breast feeding are causing pain. On exam, mom's breasts and nipples are intact, and I reviewed hand expression with mom. She has a steady flow of colostrum. I asked mom to have her nurse call me when the baby is ready to feed, so I can observe her latch the baby.  I reviewed teaching from the baby and Me book, and lactation services reviewed with mom also.   Patient Name: Stacey Graves Today's Date: 02/21/2013 Reason for consult: Initial assessment   Maternal Data Formula Feeding for Exclusion: Yes (mom in AICU) Reason for exclusion: Admission to Intensive Care Unit (ICU) post-partum Has patient been taught Hand Expression?: Yes Does the patient have breastfeeding experience prior to this delivery?: Yes  Feeding Feeding Type: Breast Fed Length of feed: 20 min  LATCH Score/Interventions                Intervention(s): Breastfeeding basics reviewed;Position options;Skin to skin     Lactation Tools Discussed/Used     Consult Status Consult Status: Follow-up Date: 02/21/13 Follow-up type: In-patient    Alfred LevinsLee, Michaella Imai Anne 02/21/2013, 3:14 PM

## 2013-02-21 NOTE — Lactation Note (Signed)
This note was copied from the chart of Stacey Marvella Beecher. Lactation Consultation Note     Follow up consult with this mom and baby, in AICU. The baby unlatched from a deep latch in side lying position, and I then assisted mom iwht a cross cradle latch  To right breast. The baby latches well, both lips flanged, mom reporting no discomfort. I showed mom how to   Wait for a wide mouth to latch the babt, how most of her areola is in baby's mouth, and to not allow baby to stay latched if she feels any discomfort. Mom very sleepy, but seemed to understand my teaching. Mom knows to call for questions/concerns.  Patient Name: Stacey Graves Today's Date: 02/21/2013 Reason for consult: Follow-up assessment   Maternal Data Formula Feeding for Exclusion: Yes (mom in AICU) Reason for exclusion: Admission to Intensive Care Unit (ICU) post-partum Has patient been taught Hand Expression?: Yes Does the patient have breastfeeding experience prior to this delivery?: Yes  Feeding Feeding Type: Breast Fed  LATCH Score/Interventions Latch: Grasps breast easily, tongue down, lips flanged, rhythmical sucking.  Audible Swallowing: A few with stimulation Intervention(s): Skin to skin  Type of Nipple: Everted at rest and after stimulation  Comfort (Breast/Nipple): Soft / non-tender     Hold (Positioning): Assistance needed to correctly position infant at breast and maintain latch. Intervention(s): Breastfeeding basics reviewed;Support Pillows;Position options;Skin to skin  LATCH Score: 8  Lactation Tools Discussed/Used     Consult Status Consult Status: Follow-up Date: 02/22/13 Follow-up type: In-patient    Alfred LevinsLee, Alivya Wegman Anne 02/21/2013, 4:46 PM

## 2013-02-21 NOTE — Progress Notes (Signed)
S: verbal report from RN, just after tx to MBU, BP remains elevated, and elevated temperature  O: temp 101, pt has rcv'd PO motrin,  BP 160/100  Filed Vitals:   02/21/13 0046 02/21/13 0101 02/21/13 0116 02/21/13 0131  BP: 146/94 167/133 167/87 155/91  Pulse: 79 76 79 86  Temp: 100.2 F (37.9 C)     TempSrc: Axillary     Resp: 18 18    Height:      Weight:      SpO2:       A: s/p VBAC w prolonged ROM, chorioamnionitis, elevated BP's,  PIH labs done this afternoon were WNL BP's had been stable prior to pt pushing   P: cath specimen UA sent for stat PCR Will draw stat PIH labs now Continue unasyn 3g IV q6 for 24h, next dose due at 5am Will give IV labetalol now,  If labs abnormal consider magnesium sulfate, if normal, may start PO antihypertensives Will d/w Dr Normand Sloopillard when labs returned   [place] Pietila, CNM

## 2013-02-21 NOTE — Progress Notes (Signed)
S: Pt now in AICU, talking on phone, in no apparent distress, infant in crib at [facility] Denies any pain, has tol PO, c/o R foot still feeling some numbness Denies HA/N/V/RUQ pain   O:  Filed Vitals:   02/21/13 0300 02/21/13 0451 02/21/13 0531 02/21/13 0600  BP: 151/93 149/88 145/95 157/97  Pulse: 76 84 83 81  Temp: 100.6 F (38.1 C) 101 F (38.3 C) 102.6 F (39.2 C)   TempSrc:  Oral Oral   Resp:   18   Height:   5\' 1"  (1.549 m)   Weight:   136 lb 3.2 oz (61.78 kg)   SpO2:   97% 98%   Results for orders placed during the hospital encounter of 02/19/13 (from the past 24 hour(s))  URIC ACID     Status: None   Collection Time    02/20/13 12:20 PM      Result Value Range   Uric Acid, Serum 6.8  2.4 - 7.0 mg/dL  COMPREHENSIVE METABOLIC PANEL     Status: Abnormal   Collection Time    02/20/13 12:20 PM      Result Value Range   Sodium 141  137 - 147 mEq/L   Potassium 4.6  3.7 - 5.3 mEq/L   Chloride 105  96 - 112 mEq/L   CO2 21  19 - 32 mEq/L   Glucose, Bld 74  70 - 99 mg/dL   BUN 6  6 - 23 mg/dL   Creatinine, Ser 1.61  0.50 - 1.10 mg/dL   Calcium 8.9  8.4 - 09.6 mg/dL   Total Protein 6.0  6.0 - 8.3 g/dL   Albumin 2.6 (*) 3.5 - 5.2 g/dL   AST 19  0 - 37 U/L   ALT 11  0 - 35 U/L   Alkaline Phosphatase 166 (*) 39 - 117 U/L   Total Bilirubin 0.3  0.3 - 1.2 mg/dL   GFR calc non Af Amer >90  >90 mL/min   GFR calc Af Amer >90  >90 mL/min  LACTATE DEHYDROGENASE     Status: None   Collection Time    02/20/13 12:20 PM      Result Value Range   LDH 187  94 - 250 U/L  CBC     Status: None   Collection Time    02/20/13 12:20 PM      Result Value Range   WBC 6.7  4.0 - 10.5 K/uL   RBC 4.33  3.87 - 5.11 MIL/uL   Hemoglobin 13.6  12.0 - 15.0 g/dL   HCT 04.5  40.9 - 81.1 %   MCV 90.8  78.0 - 100.0 fL   MCH 31.4  26.0 - 34.0 pg   MCHC 34.6  30.0 - 36.0 g/dL   RDW 91.4  78.2 - 95.6 %   Platelets 223  150 - 400 K/uL  PROTEIN / CREATININE RATIO, URINE     Status: Abnormal   Collection  Time    02/20/13  2:00 PM      Result Value Range   Creatinine, Urine 13.58     Total Protein, Urine 4     PROTEIN CREATININE RATIO 0.29 (*) 0.00 - 0.15  PROTEIN / CREATININE RATIO, URINE     Status: Abnormal   Collection Time    02/21/13  2:15 AM      Result Value Range   Creatinine, Urine 57.86     Total Protein, Urine 44.1     PROTEIN CREATININE RATIO  0.76 (*) 0.00 - 0.15  CBC     Status: Abnormal   Collection Time    02/21/13  4:08 AM      Result Value Range   WBC 13.7 (*) 4.0 - 10.5 K/uL   RBC 4.32  3.87 - 5.11 MIL/uL   Hemoglobin 13.7  12.0 - 15.0 g/dL   HCT 16.139.1  09.636.0 - 04.546.0 %   MCV 90.5  78.0 - 100.0 fL   MCH 31.7  26.0 - 34.0 pg   MCHC 35.0  30.0 - 36.0 g/dL   RDW 40.913.3  81.111.5 - 91.415.5 %   Platelets 182  150 - 400 K/uL  COMPREHENSIVE METABOLIC PANEL     Status: Abnormal   Collection Time    02/21/13  4:08 AM      Result Value Range   Sodium 138  137 - 147 mEq/L   Potassium 4.9  3.7 - 5.3 mEq/L   Chloride 104  96 - 112 mEq/L   CO2 21  19 - 32 mEq/L   Glucose, Bld 109 (*) 70 - 99 mg/dL   BUN 9  6 - 23 mg/dL   Creatinine, Ser 7.821.12 (*) 0.50 - 1.10 mg/dL   Calcium 8.7  8.4 - 95.610.5 mg/dL   Total Protein 5.6 (*) 6.0 - 8.3 g/dL   Albumin 2.1 (*) 3.5 - 5.2 g/dL   AST 43 (*) 0 - 37 U/L   ALT 17  0 - 35 U/L   Alkaline Phosphatase 159 (*) 39 - 117 U/L   Total Bilirubin 0.5  0.3 - 1.2 mg/dL   GFR calc non Af Amer 65 (*) >90 mL/min   GFR calc Af Amer 75 (*) >90 mL/min  LACTATE DEHYDROGENASE     Status: Abnormal   Collection Time    02/21/13  4:08 AM      Result Value Range   LDH 465 (*) 94 - 250 U/L  URIC ACID     Status: Abnormal   Collection Time    02/21/13  4:08 AM      Result Value Range   Uric Acid, Serum 8.1 (*) 2.4 - 7.0 mg/dL     A: PPD0, VBAC Pre-eclampsia,  Chorioamnionitis   P: rcv'd mag sulfate bolus 4g, now at 2g/hour unasyn 3g IVPB x24hr continue tylenol for fever  Strict I/O  Will redraw labs at noon today w mag level  D/w Dr Normand Sloopillard

## 2013-02-21 NOTE — Anesthesia Postprocedure Evaluation (Signed)
  Anesthesia Post-op Note  Patient: Stacey Graves  Procedure(s) Performed: * No procedures listed *  Patient Location: PACU and A-ICU  Anesthesia Type:Epidural  Level of Consciousness: awake, alert , oriented and patient cooperative  Airway and Oxygen Therapy: Patient Spontanous Breathing  Post-op Pain: none  Post-op Assessment: Post-op Vital signs reviewed, Patient's Cardiovascular Status Stable, Respiratory Function Stable, No signs of Nausea or vomiting, Adequate PO intake and Pain level controlled  Post-op Vital Signs: Reviewed and stable  Complications: No apparent anesthesia complications

## 2013-02-21 NOTE — Progress Notes (Signed)
UR chart review completed.  

## 2013-02-22 LAB — CBC
HCT: 34.9 % — ABNORMAL LOW (ref 36.0–46.0)
Hemoglobin: 12.1 g/dL (ref 12.0–15.0)
MCH: 31.4 pg (ref 26.0–34.0)
MCHC: 34.7 g/dL (ref 30.0–36.0)
MCV: 90.6 fL (ref 78.0–100.0)
Platelets: 164 10*3/uL (ref 150–400)
RBC: 3.85 MIL/uL — ABNORMAL LOW (ref 3.87–5.11)
RDW: 13.8 % (ref 11.5–15.5)
WBC: 8.2 10*3/uL (ref 4.0–10.5)

## 2013-02-22 LAB — COMPREHENSIVE METABOLIC PANEL
ALBUMIN: 2 g/dL — AB (ref 3.5–5.2)
ALK PHOS: 126 U/L — AB (ref 39–117)
ALT: 14 U/L (ref 0–35)
AST: 36 U/L (ref 0–37)
BUN: 8 mg/dL (ref 6–23)
CO2: 24 mEq/L (ref 19–32)
Calcium: 8.1 mg/dL — ABNORMAL LOW (ref 8.4–10.5)
Chloride: 105 mEq/L (ref 96–112)
Creatinine, Ser: 0.69 mg/dL (ref 0.50–1.10)
GFR calc Af Amer: >90 mL/min (ref >90)
GFR calc non Af Amer: >90 mL/min (ref >90)
Glucose, Bld: 116 mg/dL — ABNORMAL HIGH (ref 70–99)
POTASSIUM: 4 meq/L (ref 3.7–5.3)
Sodium: 139 mEq/L (ref 137–147)
TOTAL PROTEIN: 5.4 g/dL — AB (ref 6.0–8.3)
Total Bilirubin: 0.3 mg/dL (ref 0.3–1.2)

## 2013-02-22 LAB — LACTATE DEHYDROGENASE: LDH: 378 U/L — ABNORMAL HIGH (ref 94–250)

## 2013-02-22 LAB — MAGNESIUM: Magnesium: 6.7 mg/dL (ref 1.5–2.5)

## 2013-02-22 LAB — URIC ACID: Uric Acid, Serum: 6.2 mg/dL (ref 2.4–7.0)

## 2013-02-22 NOTE — Lactation Note (Signed)
This note was copied from the chart of Stacey Graves. Lactation Consultation Note  Patient Name: Stacey Graves Today's Date: 02/22/2013   Visited Mom in the AICU, baby 5437 hrs old.  Mom and baby resting.  Mom denies any difficulty with breast feeding.  Encouraged skin to skin when she awakens, and feeding baby often when she cues.  To follow up in am, or call for help prn.     02/22/2013, 1:21 PM

## 2013-02-22 NOTE — Progress Notes (Addendum)
Post Partum Day 2 Subjective: Reports feeling well.  Ambulating, voiding and tol po liquids and solids without difficulty.  Denies headache, vision chgs, RUQ pain.  Has some contd swelling in her feet.  Pos flatus, pos BM.  Reports perineal pain well controlled with meds. Magnesium off at 0650 this AM.  Afebrile for over 24hrs.  Breastfeeding without difficulty.  Objective: Blood pressure 117/90, pulse 65, temperature 97.6 F (36.4 C), temperature source Oral, resp. rate 20, height _0  (1.549 m), weight 60.601 kg (133 lb 9.6 oz), last menstrual period 05/22/2012, SpO2 98.00%,  currently breastfeeding. Filed Vitals:   02/22/13 0600 02/22/13 0826 02/22/13 1015 02/22/13 1205  BP: 117/78  138/79 117/90  Pulse: 72   65  Temp:  97.6 F (36.4 C)  97.6 F (36.4 C)  TempSrc:  Oral  Oral  Resp:      Height:      Weight: 60.601 kg (133 lb 9.6 oz)     SpO2: 98%   98%   Physical Exam:  General: alert, cooperative and no distress Heart:  RRR Lungs:  CTA bilat Abd:  Soft, NT with pos BS x 4 quads Lochia: appropriate, sm rubra Uterine Fundus: firm, NT 1 below umb Incision: Laceration repair healing well DVT Evaluation: No evidence of DVT seen on physical exam. Negative Homan's sign. Trace calf/ankle edema. DTRs 2+, no clonus  I/O last 3 completed shifts: In: 5519.2 [P.O.:1950; I.V.:3169.2; IV Piggyback:400] Out: 7550 [Urine:7250; Blood:300] Total I/O In: 600 [P.O.:600] Out: 600 [Urine:600]  Results for orders placed during the hospital encounter of 02/19/13 (from the past 48 hour(s))  PROTEIN / CREATININE RATIO, URINE     Status: Abnormal   Collection Time    02/21/13  2:15 AM      Result Value Range   Creatinine, Urine 57.86     Total Protein, Urine 44.1     Comment: NO NORMAL RANGE ESTABLISHED FOR THIS TEST   PROTEIN CREATININE RATIO 0.76 (*) 0.00 - 0.15   Comment: Performed at [facility]  CBC     Status: Abnormal   Collection Time    02/21/13  4:08 AM      Result  Value Range   WBC 13.7 (*) 4.0 - 10.5 K/uL   RBC 4.32  3.87 - 5.11 MIL/uL   Hemoglobin 13.7  12.0 - 15.0 g/dL   HCT 39.1  36.0 - 46.0 %   MCV 90.5  78.0 - 100.0 fL   MCH 31.7  26.0 - 34.0 pg   MCHC 35.0  30.0 - 36.0 g/dL   RDW 13.3  11.5 - 15.5 %   Platelets 182  150 - 400 K/uL  COMPREHENSIVE METABOLIC PANEL     Status: Abnormal   Collection Time    02/21/13  4:08 AM      Result Value Range   Sodium 138  137 - 147 mEq/L   Potassium 4.9  3.7 - 5.3 mEq/L   Chloride 104  96 - 112 mEq/L   CO2 21  19 - 32 mEq/L   Glucose, Bld 109 (*) 70 - 99 mg/dL   BUN 9  6 - 23 mg/dL   Creatinine, Ser 1.12 (*) 0.50 - 1.10 mg/dL   Calcium 8.7  8.4 - 10.5 mg/dL   Total Protein 5.6 (*) 6.0 - 8.3 g/dL   Albumin 2.1 (*) 3.5 - 5.2 g/dL   AST 43 (*) 0 - 37 U/L   ALT 17  0 - 35 U/L  Alkaline Phosphatase 159 (*) 39 - 117 U/L   Total Bilirubin 0.5  0.3 - 1.2 mg/dL   GFR calc non Af Amer 65 (*) >90 mL/min   GFR calc Af Amer 75 (*) >90 mL/min   Comment: (NOTE)     The eGFR has been calculated using the CKD EPI equation.     This calculation has not been validated in all clinical situations.     eGFR's persistently <90 mL/min signify possible Chronic Kidney     Disease.  LACTATE DEHYDROGENASE     Status: Abnormal   Collection Time    02/21/13  4:08 AM      Result Value Range   LDH 465 (*) 94 - 250 U/L  URIC ACID     Status: Abnormal   Collection Time    02/21/13  4:08 AM      Result Value Range   Uric Acid, Serum 8.1 (*) 2.4 - 7.0 mg/dL  CBC WITH DIFFERENTIAL     Status: Abnormal   Collection Time    02/21/13  9:50 AM      Result Value Range   WBC 13.7 (*) 4.0 - 10.5 K/uL   RBC 4.10  3.87 - 5.11 MIL/uL   Hemoglobin 12.8  12.0 - 15.0 g/dL   HCT 36.7  36.0 - 46.0 %   MCV 89.5  78.0 - 100.0 fL   MCH 31.2  26.0 - 34.0 pg   MCHC 34.9  30.0 - 36.0 g/dL   RDW 13.3  11.5 - 15.5 %   Platelets 180  150 - 400 K/uL   Neutrophils Relative % 73  43 - 77 %   Neutro Abs 10.0 (*) 1.7 - 7.7 K/uL    Lymphocytes Relative 16  12 - 46 %   Lymphs Abs 2.2  0.7 - 4.0 K/uL   Monocytes Relative 11  3 - 12 %   Monocytes Absolute 1.6 (*) 0.1 - 1.0 K/uL   Eosinophils Relative 0  0 - 5 %   Eosinophils Absolute 0.0  0.0 - 0.7 K/uL   Basophils Relative 0  0 - 1 %   Basophils Absolute 0.0  0.0 - 0.1 K/uL  CULTURE, BLOOD (ROUTINE X 2)     Status: None   Collection Time    02/21/13 10:05 AM      Result Value Range   Specimen Description BLOOD RIGHT HAND     Special Requests NONE 10CC BAA     Culture  Setup Time       Value: 02/21/2013 14:36     Performed at [facility]   Culture       Value:        BLOOD CULTURE RECEIVED NO GROWTH TO DATE CULTURE WILL BE HELD FOR 5 DAYS BEFORE ISSUING A FINAL NEGATIVE REPORT     Performed at [facility]   Report Status PENDING    CBC     Status: Abnormal   Collection Time    02/21/13 12:04 PM      Result Value Range   WBC 14.6 (*) 4.0 - 10.5 K/uL   RBC 4.01  3.87 - 5.11 MIL/uL   Hemoglobin 12.5  12.0 - 15.0 g/dL   HCT 36.0  36.0 - 46.0 %   MCV 89.8  78.0 - 100.0 fL   MCH 31.2  26.0 - 34.0 pg   MCHC 34.7  30.0 - 36.0 g/dL   RDW 13.3  11.5 - 15.5 %  Platelets 161  150 - 400 K/uL  COMPREHENSIVE METABOLIC PANEL     Status: Abnormal   Collection Time    02/21/13 12:04 PM      Result Value Range   Sodium 137  137 - 147 mEq/L   Potassium 3.7  3.7 - 5.3 mEq/L   Comment: DELTA CHECK NOTED     REPEATED TO VERIFY   Chloride 104  96 - 112 mEq/L   CO2 19  19 - 32 mEq/L   Glucose, Bld 105 (*) 70 - 99 mg/dL   BUN 8  6 - 23 mg/dL   Creatinine, Ser 0.95  0.50 - 1.10 mg/dL   Calcium 8.4  8.4 - 10.5 mg/dL   Total Protein 5.1 (*) 6.0 - 8.3 g/dL   Albumin 1.9 (*) 3.5 - 5.2 g/dL   AST 40 (*) 0 - 37 U/L   ALT 14  0 - 35 U/L   Alkaline Phosphatase 131 (*) 39 - 117 U/L   Total Bilirubin 0.5  0.3 - 1.2 mg/dL   GFR calc non Af Amer 79 (*) >90 mL/min   GFR calc Af Amer >90  >90 mL/min   Comment: (NOTE)     The eGFR has been calculated using the  CKD EPI equation.     This calculation has not been validated in all clinical situations.     eGFR's persistently <90 mL/min signify possible Chronic Kidney     Disease.  URIC ACID     Status: Abnormal   Collection Time    02/21/13 12:04 PM      Result Value Range   Uric Acid, Serum 7.4 (*) 2.4 - 7.0 mg/dL  MAGNESIUM     Status: Abnormal   Collection Time    02/21/13 12:04 PM      Result Value Range   Magnesium 4.5 (*) 1.5 - 2.5 mg/dL  LACTATE DEHYDROGENASE     Status: Abnormal   Collection Time    02/21/13 12:04 PM      Result Value Range   LDH 399 (*) 94 - 250 U/L  CBC     Status: Abnormal   Collection Time    02/22/13  5:35 AM      Result Value Range   WBC 8.2  4.0 - 10.5 K/uL   RBC 3.85 (*) 3.87 - 5.11 MIL/uL   Hemoglobin 12.1  12.0 - 15.0 g/dL   HCT 34.9 (*) 36.0 - 46.0 %   MCV 90.6  78.0 - 100.0 fL   MCH 31.4  26.0 - 34.0 pg   MCHC 34.7  30.0 - 36.0 g/dL   RDW 13.8  11.5 - 15.5 %   Platelets 164  150 - 400 K/uL  COMPREHENSIVE METABOLIC PANEL     Status: Abnormal   Collection Time    02/22/13  5:35 AM      Result Value Range   Sodium 139  137 - 147 mEq/L   Potassium 4.0  3.7 - 5.3 mEq/L   Chloride 105  96 - 112 mEq/L   CO2 24  19 - 32 mEq/L   Glucose, Bld 116 (*) 70 - 99 mg/dL   BUN 8  6 - 23 mg/dL   Creatinine, Ser 0.69  0.50 - 1.10 mg/dL   Calcium 8.1 (*) 8.4 - 10.5 mg/dL   Total Protein 5.4 (*) 6.0 - 8.3 g/dL   Albumin 2.0 (*) 3.5 - 5.2 g/dL   AST 36  0 - 37 U/L  ALT 14  0 - 35 U/L   Alkaline Phosphatase 126 (*) 39 - 117 U/L   Total Bilirubin 0.3  0.3 - 1.2 mg/dL   GFR calc non Af Amer >90  >90 mL/min   GFR calc Af Amer >90  >90 mL/min   Comment: (NOTE)     The eGFR has been calculated using the CKD EPI equation.     This calculation has not been validated in all clinical situations.     eGFR's persistently <90 mL/min signify possible Chronic Kidney     Disease.  LACTATE DEHYDROGENASE     Status: Abnormal   Collection Time    02/22/13  5:35 AM       Result Value Range   LDH 378 (*) 94 - 250 U/L  MAGNESIUM     Status: Abnormal   Collection Time    02/22/13  5:35 AM      Result Value Range   Magnesium 6.7 (*) 1.5 - 2.5 mg/dL   Comment: REPEATED TO VERIFY     CRITICAL RESULT CALLED TO, READ BACK BY AND VERIFIED WITH:     Keitha Butte RN._0  ON 1.17.15 BY CALDWELL T  URIC ACID     Status: None   Collection Time    02/22/13  5:35 AM      Result Value Range   Uric Acid, Serum 6.2  2.4 - 7.0 mg/dL    Assessment/Plan: Stable s/p vag delivery/successful VBAC at 39w 1d Preeclampsia-s/p magnesium sulfate Chorioamnionitis-Afebrile x 30hrs  Will transfer to mother baby. Plan for discharge tomorrow. Infant cannot be discharged until after midnight tonight.    LOS: 3 days   [place] O. 02/22/2013, 2:44 PM

## 2013-02-23 MED ORDER — IBUPROFEN 600 MG PO TABS: 600.0000 mg | ORAL_TABLET | Freq: Four times a day (QID) | ORAL | Status: DC

## 2013-02-23 NOTE — Discharge Summary (Signed)
Vaginal Delivery Discharge Summary  Stacey Graves  DOB:    [birth year] MRN:   [id number] CSN:    [id number]  Date of admission:                  02/19/2013  Date of discharge:                   02/23/2013  Procedures this admission: VBAC, PCN and unasyn,  mag sulfate postpartum for pre-eclampsia   Date of Delivery: 02/20/2013  Newborn Data:  Live born female  Birth Weight: 6 lb 4.5 oz (2849 g) APGAR: 8, 9  Home with mother.   History of Present Illness:  Stacey Graves is a 33 y.o. female, G2P1102, who presents at 1224w1d weeks gestation. [facility]. She was admitted rupture of membranes. Her pregnancy has been complicated by: none.  Hospital course:  The patient was admitted for SROM, with a closed cervix, she rcv'd pitocin, and PCN for +GBS. she had elevated BP's, initially PIH labs were normal, after delivery she continued to have elevated BP's and PIH labs were then elevated including PCR. She rcv'd mag sulfate for 24 hours and BP's improved.    Her labor was complicated by prolonged SROM and  Presumed chorioamnionitis with elevated temperature. She proceeded to have a vaginal delivery of a healthy infant. Her delivery was not complicated. Her postpartum course was complicated, by pre-eclampsia, and fever, she has remained abfrebile for >24hrs. She was discharged to home on postpartum day 2 doing well.  Feeding:  breast  Contraception:  condoms  Discharge hemoglobin:  Hemoglobin  Date Value Range Status  02/22/2013 12.1  12.0 - 15.0 g/dL Final     HCT  Date Value Range Status  02/22/2013 34.9* 36.0 - 46.0 % Final    Discharge Physical Exam:   General: alert and no distress Lochia: appropriate Uterine Fundus: firm Incision: healing well DVT Evaluation: No evidence of DVT seen on physical exam. Negative Homan's sign. No significant calf/ankle  edema.  Intrapartum Procedures: GBS prophylaxis and succesful VBAC, PCN and then unasyn for presumed chorioamnionitis  Postpartum Procedures: pre-eclampsia Complications-Operative and Postpartum: 2nd degree perineal laceration  Discharge Diagnoses: Term Pregnancy-delivered and Preelampsia  Discharge Information:  Activity:           pelvic rest Diet:                routine Medications: PNV and Ibuprofen Condition:      stable Instructions:   Postpartum Care After Vaginal Delivery  After you deliver your newborn (postpartum period), the usual stay in the hospital is 24 72 hours. If there were problems with your labor or delivery, or if you have other medical problems, you might be in the hospital longer.  While you are in the hospital, you will receive help and instructions on how to care for yourself and your newborn during the postpartum period.  While you are in the hospital:  Be sure to tell your nurses if you have pain or discomfort, as well as where you feel the pain and what makes the pain worse.  If you had an incision made near your vagina (episiotomy) or if you had some tearing during delivery, the nurses may put ice packs on your episiotomy or tear. The ice packs may help to reduce the pain and swelling.  If you are breastfeeding, you may feel uncomfortable  contractions of your uterus for a couple of weeks. This is normal. The contractions help your uterus get back to normal size.  It is normal to have some bleeding after delivery.  For the first 1 3 days after delivery, the flow is red and the amount may be similar to a period.  It is common for the flow to start and stop.  In the first few days, you may pass some small clots. Let your nurses know if you begin to pass large clots or your flow increases.  Do not  flush blood clots down the toilet before having the nurse look at them.  During the next 3 10 days after delivery, your flow should become more watery and pink  or brown-tinged in color.  Ten to fourteen days after delivery, your flow should be a small amount of yellowish-white discharge.  The amount of your flow will decrease over the first few weeks after delivery. Your flow may stop in 6 8 weeks. Most women have had their flow stop by 12 weeks after delivery.  You should change your sanitary pads frequently.  Wash your hands thoroughly with soap and water for at least 20 seconds after changing pads, using the toilet, or before holding or feeding your newborn.  You should feel like you need to empty your bladder within the first 6 8 hours after delivery.  In case you become weak, lightheaded, or faint, call your nurse before you get out of bed for the first time and before you take a shower for the first time.  Within the first few days after delivery, your breasts may begin to feel tender and full. This is called engorgement. Breast tenderness usually goes away within 48 72 hours after engorgement occurs. You may also notice milk leaking from your breasts. If you are not breastfeeding, do not stimulate your breasts. Breast stimulation can make your breasts produce more milk.  Spending as much time as possible with your newborn is very important. During this time, you and your newborn can feel close and get to know each other. Having your newborn stay in your room (rooming in) will help to strengthen the bond with your newborn. It will give you time to get to know your newborn and become comfortable caring for your newborn.  Your hormones change after delivery. Sometimes the hormone changes can temporarily cause you to feel sad or tearful. These feelings should not last more than a few days. If these feelings last longer than that, you should talk to your caregiver.  If desired, talk to your caregiver about methods of family planning or contraception.  Talk to your caregiver about immunizations. Your caregiver may want you to have the following  immunizations before leaving the hospital:  Tetanus, diphtheria, and pertussis (Tdap) or tetanus and diphtheria (Td) immunization. It is very important that you and your family (including grandparents) or others caring for your newborn are up-to-date with the Tdap or Td immunizations. The Tdap or Td immunization can help protect your newborn from getting ill.  Rubella immunization.  Varicella (chickenpox) immunization.  Influenza immunization. You should receive this annual immunization if you did not receive the immunization during your pregnancy. Document Released: 11/20/2006 Document Revised: 10/18/2011 Document Reviewed: 09/20/2011 [facility] Patient Information 2014 [place], [place].   Postpartum Depression and Baby Blues  The postpartum period begins right after the birth of a baby. During this time, there is often a great amount of joy and excitement. It is also a  time of considerable changes in the life of the parent(s). Regardless of how many times a mother gives birth, each child brings new challenges and dynamics to the family. It is not unusual to have feelings of excitement accompanied by confusing shifts in moods, emotions, and thoughts. All mothers are at risk of developing postpartum depression or the "baby blues." These mood changes can occur right after giving birth, or they may occur many months after giving birth. The baby blues or postpartum depression can be mild or severe. Additionally, postpartum depression can resolve rather quickly, or it can be a long-term condition. CAUSES Elevated hormones and their rapid decline are thought to be a main cause of postpartum depression and the baby blues. There are a number of hormones that radically change during and after pregnancy. Estrogen and progesterone usually decrease immediately after delivering your baby. The level of thyroid hormone and various cortisol steroids also rapidly drop. Other factors that play a major role in these  changes include major life events and genetics.  RISK FACTORS If you have any of the following risks for the baby blues or postpartum depression, know what symptoms to watch out for during the postpartum period. Risk factors that may increase the likelihood of getting the baby blues or postpartum depression include:  Havinga personal or family history of depression.  Having depression while being pregnant.  Having premenstrual or oral contraceptive-associated mood issues.  Having exceptional life stress.  Having marital conflict.  Lacking a social support network.  Having a baby with special needs.  Having health problems such as diabetes. SYMPTOMS Baby blues symptoms include:  Brief fluctuations in mood, such as going from extreme happiness to sadness.  Decreased concentration.  Difficulty sleeping.  Crying spells, tearfulness.  Irritability.  Anxiety. Postpartum depression symptoms typically begin within the first month after giving birth. These symptoms include:  Difficulty sleeping or excessive sleepiness.  Marked weight loss.  Agitation.  Feelings of worthlessness.  Lack of interest in activity or food. Postpartum psychosis is a very concerning condition and can be dangerous. Fortunately, it is rare. Displaying any of the following symptoms is cause for immediate medical attention. Postpartum psychosis symptoms include:  Hallucinations and delusions.  Bizarre or disorganized behavior.  Confusion or disorientation. DIAGNOSIS  A diagnosis is made by an evaluation of your symptoms. There are no medical or lab tests that lead to a diagnosis, but there are various questionnaires that a caregiver may use to identify those with the baby blues, postpartum depression, or psychosis. Often times, a screening tool called the [place] Postnatal Depression Scale is used to diagnose depression in the postpartum period.  TREATMENT The baby blues usually goes away on its  own in 1 to 2 weeks. Social support is often all that is needed. You should be encouraged to get adequate sleep and rest. Occasionally, you may be given medicines to help you sleep.  Postpartum depression requires treatment as it can last several months or longer if it is not treated. Treatment may include individual or group therapy, medicine, or both to address any social, physiological, and psychological factors that may play a role in the depression. Regular exercise, a healthy diet, rest, and social support may also be strongly recommended.  Postpartum psychosis is more serious and needs treatment right away. Hospitalization is often needed. HOME CARE INSTRUCTIONS  Get as much rest as you can. Nap when the baby sleeps.  Exercise regularly. Some women find yoga and walking to be beneficial.  Eat  a balanced and nourishing diet.  Do little things that you enjoy. Have a cup of tea, take a bubble bath, read your favorite magazine, or listen to your favorite music.  Avoid alcohol.  Ask for help with household chores, cooking, grocery shopping, or running errands as needed. Do not try to do everything.  Talk to people close to you about how you are feeling. Get support from your partner, family members, friends, or other new moms.  Try to stay positive in how you think. Think about the things you are grateful for.  Do not spend a lot of time alone.  Only take medicine as directed by your caregiver.  Keep all your postpartum appointments.  Let your caregiver know if you have any concerns. SEEK MEDICAL CARE IF: You are having a reaction or problems with your medicine. SEEK IMMEDIATE MEDICAL CARE IF:  You have suicidal feelings.  You feel you may harm the baby or someone else. Document Released: 10/28/2003 Document Revised: 04/17/2011 Document Reviewed: 11/29/2010 [facility] Patient Information 2014 [place], [place].   Discharge to: home  Follow-up Information   Follow up with  [facility]. Call in 6 weeks.   Specialty:  Obstetrics and Gynecology   Contact information:   [address] [place] [address] [phone]       Malissa Hippo 02/23/2013

## 2013-02-23 NOTE — Discharge Instructions (Signed)
Vaginal Delivery Care After Refer to this sheet in the next few weeks. These discharge instructions provide you with information on caring for yourself after delivery. Your caregiver may also give you specific instructions. Your treatment has been planned according to the most current medical practices available, but problems sometimes occur. Call your caregiver if you have any problems or questions after you go home. HOME CARE INSTRUCTIONS  Take over-the-counter or prescription medicines only as directed by your caregiver or pharmacist.  Do not drink alcohol, especially if you are breastfeeding or taking medicine to relieve pain.  Do not chew or smoke tobacco.  Do not use illegal drugs.  Continue to use good perineal care. Good perineal care includes:  Wiping your perineum from front to back.  Keeping your perineum clean. Use water bottle after going to the bathroom   Soak in the basin 3-4 times per day for about 15 minutes for about a week.   Do not use tampons or douche until your caregiver says it is okay.  Shower, wash your hair, and take tub baths as directed by your caregiver.  Wear a well-fitting bra that provides breast support.  Eat healthy foods.  Drink enough fluids to keep your urine clear or pale yellow.  Eat high-fiber foods such as whole grain cereals and breads, brown rice, beans, and fresh fruits and vegetables every day. These foods may help prevent or relieve constipation.  Follow your cargiver's recommendations regarding resumption of activities such as climbing stairs, driving, lifting, exercising, or traveling.  Talk to your caregiver about resuming sexual activities. Resumption of sexual activities is dependent upon your risk of infection, your rate of healing, and your comfort and desire to resume sexual activity.  Try to have someone help you with your household activities and your newborn for at least a few days after you leave the hospital.  Rest as  much as possible. Try to rest or take a nap when your newborn is sleeping.  Increase your activities gradually.  Keep all of your scheduled postpartum appointments. It is very important to keep your scheduled follow-up appointments. At these appointments, your caregiver will be checking to make sure that you are healing physically and emotionally. SEEK MEDICAL CARE IF:   You are passing large clots from your vagina. Save any clots to show your caregiver.  You have a foul smelling discharge from your vagina.  You have trouble urinating.  You are urinating frequently.  You have pain when you urinate.  You have a change in your bowel movements.  You have increasing redness, pain, or swelling near your vaginal incision (episiotomy) or vaginal tear.  You have pus draining from your episiotomy or vaginal tear.  Your episiotomy or vaginal tear is separating.  You have painful, hard, or reddened breasts.  You have a severe headache.  You have blurred vision or see spots.  You feel sad or depressed.  You have thoughts of hurting yourself or your newborn.  You have questions about your care, the care of your newborn, or medicines.  You are dizzy or lightheaded.  You have a rash.  You have nausea or vomiting.  You were breastfeeding and have not had a menstrual period within 12 weeks after you stopped breastfeeding.  You are not breastfeeding and have not had a menstrual period by the 12th week after delivery.  You have a fever. SEEK IMMEDIATE MEDICAL CARE IF:   You have persistent pain.  You have chest pain.  You  have shortness of breath.  You faint.  You have leg pain.  You have stomach pain.  Your vaginal bleeding saturates two or more sanitary pads in 1 hour. MAKE SURE YOU:   Understand these instructions.  Will watch your condition.  Will get help right away if you are not doing well or get worse. Document Released: 01/21/2000 Document Revised:  10/18/2011 Document Reviewed: 09/20/2011 [facility] Patient Information 2014 [employer] [place].  Breastfeeding Deciding to breastfeed is one of the best choices you can make for you and your baby. A change in hormones during pregnancy causes your breast tissue to grow and increases the number and size of your milk ducts. These hormones also allow proteins, sugars, and fats from your blood supply to make breast milk in your milk-producing glands. Hormones prevent breast milk from being released before your baby is born as well as prompt milk flow after birth. Once breastfeeding has begun, thoughts of your baby, as well as his or her sucking or crying, can stimulate the release of milk from your milk-producing glands.  BENEFITS OF BREASTFEEDING For Your Baby  Your first milk (colostrum) helps your baby's digestive system function better.   There are antibodies in your milk that help your baby fight off infections.   Your baby has a lower incidence of asthma, allergies, and sudden infant death syndrome.   The nutrients in breast milk are better for your baby than infant formulas and are designed uniquely for your baby's needs.   Breast milk improves your baby's brain development.   Your baby is less likely to develop other conditions, such as childhood obesity, asthma, or type 2 diabetes mellitus.  For You   Breastfeeding helps to create a very special bond between you and your baby.   Breastfeeding is convenient. Breast milk is always available at the correct temperature and costs nothing.   Breastfeeding helps to burn calories and helps you lose the weight gained during pregnancy.   Breastfeeding makes your uterus contract to its prepregnancy size faster and slows bleeding (lochia) after you give birth.   Breastfeeding helps to lower your risk of developing type 2 diabetes mellitus, osteoporosis, and breast or ovarian cancer later in life. SIGNS THAT YOUR BABY IS HUNGRY Early  Signs of Hunger  Increased alertness or activity.  Stretching.  Movement of the head from side to side.  Movement of the head and opening of the mouth when the corner of the mouth or cheek is stroked (rooting).  Increased sucking sounds, smacking lips, cooing, sighing, or squeaking.  Hand-to-mouth movements.  Increased sucking of fingers or hands. Late Signs of Hunger  Fussing.  Intermittent crying. Extreme Signs of Hunger Signs of extreme hunger will require calming and consoling before your baby will be able to breastfeed successfully. Do not wait for the following signs of extreme hunger to occur before you initiate breastfeeding:   Restlessness.  A loud, strong cry.   Screaming. BREASTFEEDING BASICS Breastfeeding Initiation  Find a comfortable place to sit or lie down, with your neck and back well supported.  Place a pillow or rolled up blanket under your baby to bring him or her to the level of your breast (if you are seated). Nursing pillows are specially designed to help support your arms and your baby while you breastfeed.  Make sure that your baby's abdomen is facing your abdomen.   Gently massage your breast. With your fingertips, massage from your chest wall toward your nipple in a circular motion.  This encourages milk flow. You may need to continue this action during the feeding if your milk flows slowly.  Support your breast with 4 fingers underneath and your thumb above your nipple. Make sure your fingers are well away from your nipple and your baby's mouth.   Stroke your baby's lips gently with your finger or nipple.   When your baby's mouth is open wide enough, quickly bring your baby to your breast, placing your entire nipple and as much of the colored area around your nipple (areola) as possible into your baby's mouth.   More areola should be visible above your baby's upper lip than below the lower lip.   Your baby's tongue should be between his  or her lower gum and your breast.   Ensure that your baby's mouth is correctly positioned around your nipple (latched). Your baby's lips should create a seal on your breast and be turned out (everted).  It is common for your baby to suck about 2 3 minutes in order to start the flow of breast milk. Latching Teaching your baby how to latch on to your breast properly is very important. An improper latch can cause nipple pain and decreased milk supply for you and poor weight gain in your baby. Also, if your baby is not latched onto your nipple properly, he or she may swallow some air during feeding. This can make your baby fussy. Burping your baby when you switch breasts during the feeding can help to get rid of the air. However, teaching your baby to latch on properly is still the best way to prevent fussiness from swallowing air while breastfeeding. Signs that your baby has successfully latched on to your nipple:    Silent tugging or silent sucking, without causing you pain.   Swallowing heard between every 3 4 sucks.    Muscle movement above and in front of his or her ears while sucking.  Signs that your baby has not successfully latched on to nipple:   Sucking sounds or smacking sounds from your baby while breastfeeding.  Nipple pain. If you think your baby has not latched on correctly, slip your finger into the corner of your baby's mouth to break the suction and place it between your baby's gums. Attempt breastfeeding initiation again. Signs of Successful Breastfeeding Signs from your baby:   A gradual decrease in the number of sucks or complete cessation of sucking.   Falling asleep.   Relaxation of his or her body.   Retention of a small amount of milk in his or her mouth.   Letting go of your breast by himself or herself. Signs from you:  Breasts that have increased in firmness, weight, and size 1 3 hours after feeding.   Breasts that are softer immediately after  breastfeeding.  Increased milk volume, as well as a change in milk consistency and color by the 5th day of breastfeeding.   Nipples that are not sore, cracked, or bleeding. Signs That Your Pecola Leisure is Getting Enough Milk  Wetting at least 3 diapers in a 24-hour period. The urine should be clear and pale yellow by age 33 days.  At least 3 stools in a 24-hour period by age 33 days. The stool should be soft and yellow.  At least 3 stools in a 24-hour period by age 9 days. The stool should be seedy and yellow.  No loss of weight greater than 10% of birth weight during the first 47 days of age.  Average  weight gain of 4 7 ounces (120 210 mL) per week after age 41 days.  Consistent daily weight gain by age 4 days, without weight loss after the age of 2 weeks. After a feeding, your baby may spit up a small amount. This is common. BREASTFEEDING FREQUENCY AND DURATION Frequent feeding will help you make more milk and can prevent sore nipples and breast engorgement. Breastfeed when you feel the need to reduce the fullness of your breasts or when your baby shows signs of hunger. This is called "breastfeeding on demand." Avoid introducing a pacifier to your baby while you are working to establish breastfeeding (the first 4 6 weeks after your baby is born). After this time you may choose to use a pacifier. Research has shown that pacifier use during the first year of a baby's life decreases the risk of sudden infant death syndrome (SIDS). Allow your baby to feed on each breast as long as he or she wants. Breastfeed until your baby is finished feeding. When your baby unlatches or falls asleep while feeding from the first breast, offer the second breast. Because newborns are often sleepy in the first few weeks of life, you may need to awaken your baby to get him or her to feed. Breastfeeding times will vary from baby to baby. However, the following rules can serve as a guide to help you ensure that your baby is  properly fed:  Newborns (babies 21 weeks of age or younger) may breastfeed every 1 3 hours.  Newborns should not go longer than 3 hours during the day or 5 hours during the night without breastfeeding.  You should breastfeed your baby a minimum of 8 times in a 24-hour period until you begin to introduce solid foods to your baby at around 74 months of age. BREAST MILK PUMPING Pumping and storing breast milk allows you to ensure that your baby is exclusively fed your breast milk, even at times when you are unable to breastfeed. This is especially important if you are going back to work while you are still breastfeeding or when you are not able to be present during feedings. Your lactation consultant can give you guidelines on how long it is safe to store breast milk.  A breast pump is a machine that allows you to pump milk from your breast into a sterile bottle. The pumped breast milk can then be stored in a refrigerator or freezer. Some breast pumps are operated by hand, while others use electricity. Ask your lactation consultant which type will work best for you. Breast pumps can be purchased, but some hospitals and breastfeeding support groups lease breast pumps on a monthly basis. A lactation consultant can teach you how to hand express breast milk, if you prefer not to use a pump.  CARING FOR YOUR BREASTS WHILE YOU BREASTFEED Nipples can become dry, cracked, and sore while breastfeeding. The following recommendations can help keep your breasts moisturized and healthy:  Avoid using soap on your nipples.   Wear a supportive bra. Although not required, special nursing bras and tank tops are designed to allow access to your breasts for breastfeeding without taking off your entire bra or top. Avoid wearing underwire style bras or extremely tight bras.  Air dry your nipples for 3 after each feeding.   Use only cotton bra pads to absorb leaked breast milk. Leaking of breast milk between  feedings is normal.   Use lanolin on your nipples after breastfeeding. Lanolin helps to maintain  your skin's normal moisture barrier. If you use pure lanolin you do not need to wash it off before feeding your baby again. Pure lanolin is not toxic to your baby. You may also hand express a few drops of breast milk and gently massage that milk into your nipples and allow the milk to air dry. In the first few weeks after giving birth, some women experience extremely full breasts (engorgement). Engorgement can make your breasts feel heavy, warm, and tender to the touch. Engorgement peaks within 3 5 days after you give birth. The following recommendations can help ease engorgement:  Completely empty your breasts while breastfeeding or pumping. You may want to start by applying warm, moist heat (in the shower or with warm water-soaked hand towels) just before feeding or pumping. This increases circulation and helps the milk flow. If your baby does not completely empty your breasts while breastfeeding, pump any extra milk after he or she is finished.  Wear a snug bra (nursing or regular) or tank top for 1 2 days to signal your body to slightly decrease milk production.  Apply ice packs to your breasts, unless this is too uncomfortable for you.  Make sure that your baby is latched on and positioned properly while breastfeeding. If engorgement persists after 48 hours of following these recommendations, contact your health care provider or a Advertising copywriter. OVERALL HEALTH CARE RECOMMENDATIONS WHILE BREASTFEEDING  Eat healthy foods. Alternate between meals and snacks, eating 3 of each per day. Because what you eat affects your breast milk, some of the foods may make your baby more irritable than usual. Avoid eating these foods if you are sure that they are negatively affecting your baby.  Drink milk, fruit juice, and water to satisfy your thirst (about 10 glasses a day).   Rest often, relax, and  continue to take your prenatal vitamins to prevent fatigue, stress, and anemia.  Continue breast self-awareness checks.  Avoid chewing and smoking tobacco.  Avoid alcohol and drug use. Some medicines that may be harmful to your baby can pass through breast milk. It is important to ask your health care provider before taking any medicine, including all over-the-counter and prescription medicine as well as vitamin and herbal supplements. It is possible to become pregnant while breastfeeding. If birth control is desired, ask your health care provider about options that will be safe for your baby. SEEK MEDICAL CARE IF:   You feel like you want to stop breastfeeding or have become frustrated with breastfeeding.  You have painful breasts or nipples.  Your nipples are cracked or bleeding.  Your breasts are red, tender, or warm.  You have a swollen area on either breast.  You have a fever or chills.  You have nausea or vomiting.  You have drainage other than breast milk from your nipples.  Your breasts do not become full before feedings by the 5th day after you give birth.  You feel sad and depressed.  Your baby is too sleepy to eat well.  Your baby is having trouble sleeping.   Your baby is wetting less than 3 diapers in a 24-hour period.  Your baby has less than 3 stools in a 24-hour period.  Your baby's skin or the white part of his or her eyes becomes yellow.   Your baby is not gaining weight by 53 days of age. SEEK IMMEDIATE MEDICAL CARE IF:   Your baby is overly tired (lethargic) and does not want to wake up and feed.  Your baby develops an unexplained fever. Document Released: 01/23/2005 Document Revised: 09/25/2012 Document Reviewed: 07/17/2012 [facility] Patient Information 2014 [place], [place].  Postpartum Depression and Baby Blues The postpartum period begins right after the birth of a baby. During this time, there is often a great amount of joy and excitement. It  is also a time of considerable changes in the life of the parent(s). Regardless of how many times a mother gives birth, each child brings new challenges and dynamics to the family. It is not unusual to have feelings of excitement accompanied by confusing shifts in moods, emotions, and thoughts. All mothers are at risk of developing postpartum depression or the "baby blues." These mood changes can occur right after giving birth, or they may occur many months after giving birth. The baby blues or postpartum depression can be mild or severe. Additionally, postpartum depression can resolve rather quickly, or it can be a long-term condition. CAUSES Elevated hormones and their rapid decline are thought to be a main cause of postpartum depression and the baby blues. There are a number of hormones that radically change during and after pregnancy. Estrogen and progesterone usually decrease immediately after delivering your baby. The level of thyroid hormone and various cortisol steroids also rapidly drop. Other factors that play a major role in these changes include major life events and genetics.  RISK FACTORS If you have any of the following risks for the baby blues or postpartum depression, know what symptoms to watch out for during the postpartum period. Risk factors that may increase the likelihood of getting the baby blues or postpartum depression include:  Havinga personal or family history of depression.  Having depression while being pregnant.  Having premenstrual or oral contraceptive-associated mood issues.  Having exceptional life stress.  Having marital conflict.  Lacking a social support network.  Having a baby with special needs.  Having health problems such as diabetes. SYMPTOMS Baby blues symptoms include:  Brief fluctuations in mood, such as going from extreme happiness to sadness.  Decreased concentration.  Difficulty sleeping.  Crying spells,  tearfulness.  Irritability.  Anxiety. Postpartum depression symptoms typically begin within the first month after giving birth. These symptoms include:  Difficulty sleeping or excessive sleepiness.  Marked weight loss.  Agitation.  Feelings of worthlessness.  Lack of interest in activity or food. Postpartum psychosis is a very concerning condition and can be dangerous. Fortunately, it is rare. Displaying any of the following symptoms is cause for immediate medical attention. Postpartum psychosis symptoms include:  Hallucinations and delusions.  Bizarre or disorganized behavior.  Confusion or disorientation. DIAGNOSIS  A diagnosis is made by an evaluation of your symptoms. There are no medical or lab tests that lead to a diagnosis, but there are various questionnaires that a caregiver may use to identify those with the baby blues, postpartum depression, or psychosis. Often times, a screening tool called the [place] Postnatal Depression Scale is used to diagnose depression in the postpartum period.  TREATMENT The baby blues usually goes away on its own in 1 to 2 weeks. Social support is often all that is needed. You should be encouraged to get adequate sleep and rest. Occasionally, you may be given medicines to help you sleep.  Postpartum depression requires treatment as it can last several months or longer if it is not treated. Treatment may include individual or group therapy, medicine, or both to address any social, physiological, and psychological factors that may play a role in the depression. Regular exercise, a healthy  diet, rest, and social support may also be strongly recommended.  Postpartum psychosis is more serious and needs treatment right away. Hospitalization is often needed. HOME CARE INSTRUCTIONS  Get as much rest as you can. Nap when the baby sleeps.  Exercise regularly. Some women find yoga and walking to be beneficial.  Eat a balanced and nourishing diet.  Do  little things that you enjoy. Have a cup of tea, take a bubble bath, read your favorite magazine, or listen to your favorite music.  Avoid alcohol.  Ask for help with household chores, cooking, grocery shopping, or running errands as needed. Do not try to do everything.  Talk to people close to you about how you are feeling. Get support from your partner, family members, friends, or other new moms.  Try to stay positive in how you think. Think about the things you are grateful for.  Do not spend a lot of time alone.  Only take medicine as directed by your caregiver.  Keep all your postpartum appointments.  Let your caregiver know if you have any concerns. SEEK MEDICAL CARE IF: You are having a reaction or problems with your medicine. SEEK IMMEDIATE MEDICAL CARE IF:  You have suicidal feelings.  You feel you may harm the baby or someone else. Document Released: 10/28/2003 Document Revised: 04/17/2011 Document Reviewed: 11/04/2012 [facility] Patient Information 2014 [place], [place].

## 2013-02-27 LAB — CULTURE, BLOOD (ROUTINE X 2)
Culture: NO GROWTH
Culture: NO GROWTH

## 2013-12-08 ENCOUNTER — Encounter (HOSPITAL_COMMUNITY): Payer: Self-pay | Admitting: *Deleted

## 2014-02-06 NOTE — L&D Delivery Note (Signed)
Delivery Note At 3:12 AM a viable and healthy female was delivered via  (Presentation: Right ;Occiput Anterior  ).  APGAR: 9, 9; weight  .   Placenta status: Intact, Spontaneous.  Cord: 3V  Pts water broke and she rapidly progressed to complete and delivered a vigorous female infant in the vertex ROA presentation over an intact perineum. The placenta delivered spontaneously with 3V cord. Given the rapid nature of her delivery Erin SonsVickie Latham, CNM was the delivering provider. I arrived for the repair of the perineum. A 1st degree perineal laceration was repaired with 3-0 vicryl.   Anesthesia: None, local Episiotomy: None Lacerations: Perineal Suture Repair: 3.0 vicryl Est. Blood Loss (mL):  100cc  Mom to postpartum.  Baby to Couplet care / Skin to Skin.  Aiken Withem H. 12/30/2014, 3:35 AM

## 2014-05-04 ENCOUNTER — Emergency Department (INDEPENDENT_AMBULATORY_CARE_PROVIDER_SITE_OTHER)
Admission: EM | Admit: 2014-05-04 | Discharge: 2014-05-04 | Disposition: A | Discharge status: Home / Self Care | Payer: Self-pay | Source: Home / Self Care | Attending: Emergency Medicine | Admitting: Emergency Medicine

## 2014-05-04 ENCOUNTER — Encounter (HOSPITAL_COMMUNITY): Payer: Self-pay | Admitting: *Deleted

## 2014-05-04 DIAGNOSIS — Z3490 Encounter for supervision of normal pregnancy, unspecified, unspecified trimester: Secondary | ICD-10-CM

## 2014-05-04 DIAGNOSIS — Z3201 Encounter for pregnancy test, result positive: Secondary | ICD-10-CM

## 2014-05-04 LAB — POCT PREGNANCY, URINE: Preg Test, Ur: POSITIVE — AB

## 2014-05-04 MED ORDER — PRENATAL MULTIVITAMIN CH: 1.0000 | ORAL_TABLET | Freq: Every day | ORAL | Status: AC

## 2014-05-04 NOTE — Discharge Instructions (Signed)
Vaginal Bleeding During Pregnancy, First Trimester  A small amount of bleeding (spotting) from the vagina is relatively common in early pregnancy. It usually stops on its own. Various things may cause bleeding or spotting in early pregnancy. Some bleeding may be related to the pregnancy, and some may not. In most cases, the bleeding is normal and is not a problem. However, bleeding can also be a sign of something serious. Be sure to tell your health care provider about any vaginal bleeding right away.  Some possible causes of vaginal bleeding during the first trimester include:  · Infection or inflammation of the cervix.  · Growths (polyps) on the cervix.  · Miscarriage or threatened miscarriage.  · Pregnancy tissue has developed outside of the uterus and in a fallopian tube (tubal pregnancy).  · Tiny cysts have developed in the uterus instead of pregnancy tissue (molar pregnancy).  HOME CARE INSTRUCTIONS   Watch your condition for any changes. The following actions may help to lessen any discomfort you are feeling:  · Follow your health care provider's instructions for limiting your activity. If your health care provider orders bed rest, you may need to stay in bed and only get up to use the bathroom. However, your health care provider may allow you to continue light activity.  · If needed, make plans for someone to help with your regular activities and responsibilities while you are on bed rest.  · Keep track of the number of pads you use each day, how often you change pads, and how soaked (saturated) they are. Write this down.  · Do not use tampons. Do not douche.  · Do not have sexual intercourse or orgasms until approved by your health care provider.  · If you pass any tissue from your vagina, save the tissue so you can show it to your health care provider.  · Only take over-the-counter or prescription medicines as directed by your health care provider.  · Do not take aspirin because it can make you  bleed.  · Keep all follow-up appointments as directed by your health care provider.  SEEK MEDICAL CARE IF:  · You have any vaginal bleeding during any part of your pregnancy.  · You have cramps or labor pains.  · You have a fever, not controlled by medicine.  SEEK IMMEDIATE MEDICAL CARE IF:   · You have severe cramps in your back or belly (abdomen).  · You pass large clots or tissue from your vagina.  · Your bleeding increases.  · You feel light-headed or weak, or you have fainting episodes.  · You have chills.  · You are leaking fluid or have a gush of fluid from your vagina.  · You pass out while having a bowel movement.  MAKE SURE YOU:  · Understand these instructions.  · Will watch your condition.  · Will get help right away if you are not doing well or get worse.  Document Released: 11/02/2004 Document Revised: 01/28/2013 Document Reviewed: 09/30/2012  ExitCare® Patient Information ©2015 [employer]. This information is not intended to replace advice given to you by your health care provider. Make sure you discuss any questions you have with your health care provider.

## 2014-05-04 NOTE — ED Notes (Signed)
Pt  Reports        She  Has  Nausea   And  Vomiting  And  Is  Late  On  Her  menstural  Period             She  steas  She had a  Positive  preg  Test  At  Home     -  She  Is  Awake  And  Alert  And  Appears  In no  Severe  distress

## 2014-05-04 NOTE — ED Provider Notes (Signed)
CSN: [id number]     Arrival date & time 05/04/14  1321 History   First MD Initiated Contact with Patient 05/04/14 1426     Chief Complaint  Patient presents with  . Possible Pregnancy   (Consider location/radiation/quality/duration/timing/severity/associated sxs/prior Treatment) Patient is a 34 y.o. female presenting with pregnancy problem. The history is provided by the patient. No language interpreter was used.  Possible Pregnancy This is a new problem. The current episode started more than 1 week ago. The problem occurs constantly. The problem has been gradually worsening. Nothing aggravates the symptoms. Nothing relieves the symptoms. She has tried nothing for the symptoms. The treatment provided no relief.  Pt reports she needs a test to confirm that she is pregnant  Past Medical History  Diagnosis Date  . No pertinent past medical history   . Preterm delivery   . Medical history non-contributory    Past Surgical History  Procedure Laterality Date  . Cesarean section  02/08/2011    Procedure: CESAREAN SECTION;  Surgeon: Caprice Beaverobert M Wein;  Location: [facility];  Service: Gynecology;  Laterality: N/A;   Family History  Problem Relation Age of Onset  . Hypertension Mother   . Asthma Father    History  Substance Use Topics  . Smoking status: Never Smoker   . Smokeless tobacco: Never Used  . Alcohol Use: No   OB History    Gravida Para Term Preterm AB TAB SAB Ectopic Multiple Living   2 2 1 1      2      Review of Systems  All other systems reviewed and are negative.   Allergies  Review of patient's allergies indicates no known allergies.  Home Medications   Prior to Admission medications   Medication Sig Start Date End Date Taking? Authorizing Provider  ibuprofen (ADVIL,MOTRIN) 600 MG tablet Take 1 tablet (600 mg total) by mouth every 6 (six) hours. 02/23/13   Sanda KleinShelley Lillard, CNM  Prenatal Vit-Fe Fumarate-FA (PRENATAL MULTIVITAMIN) TABS Take 1 tablet by mouth daily.      Historical Provider, MD   LMP 01/06/2014 (Within Months) Physical Exam  Constitutional: She is oriented to person, place, and time. She appears well-developed and well-nourished.  HENT:  Head: Normocephalic.  Eyes: Conjunctivae and EOM are normal. Pupils are equal, round, and reactive to light.  Neck: Normal range of motion.  Cardiovascular: Normal rate and normal heart sounds.   Pulmonary/Chest: Effort normal.  Abdominal: She exhibits no distension.  Musculoskeletal: Normal range of motion.  Neurological: She is alert and oriented to person, place, and time.  Skin: Skin is warm.  Psychiatric: She has a normal mood and affect.  Nursing note and vitals reviewed.   ED Course  Procedures (including critical care time) Labs Review Labs Reviewed  POCT PREGNANCY, URINE - Abnormal; Notable for the following:    Preg Test, Ur POSITIVE (*)    All other components within normal limits    Imaging Review No results found.   MDM  poitive pregnancy   1. Pregnancy    AVS Prenatal vitamins    Elson AreasLeslie K Davidlee Jeanbaptiste, [place] JerseyPA-C 05/05/14 1859

## 2014-06-18 ENCOUNTER — Other Ambulatory Visit: Payer: Self-pay | Admitting: Obstetrics and Gynecology

## 2014-06-18 LAB — OB RESULTS CONSOLE HEPATITIS B SURFACE ANTIGEN: HEP B S AG: NEGATIVE

## 2014-06-18 LAB — OB RESULTS CONSOLE ABO/RH: RH Type: NEGATIVE

## 2014-06-18 LAB — OB RESULTS CONSOLE GC/CHLAMYDIA
Chlamydia: NEGATIVE
GC PROBE AMP, GENITAL: NEGATIVE

## 2014-06-18 LAB — OB RESULTS CONSOLE RPR: RPR: NONREACTIVE

## 2014-06-18 LAB — OB RESULTS CONSOLE HIV ANTIBODY (ROUTINE TESTING): HIV: NONREACTIVE

## 2014-06-18 LAB — OB RESULTS CONSOLE ANTIBODY SCREEN: ANTIBODY SCREEN: NEGATIVE

## 2014-06-18 LAB — OB RESULTS CONSOLE RUBELLA ANTIBODY, IGM: Rubella: IMMUNE

## 2014-06-19 LAB — CYTOLOGY - PAP

## 2014-10-24 ENCOUNTER — Inpatient Hospital Stay (HOSPITAL_COMMUNITY)
Admission: AD | Admit: 2014-10-24 | Discharge: 2014-10-24 | Disposition: A | Discharge status: Home / Self Care | Payer: Medicaid Other | Source: Ambulatory Visit | Attending: Obstetrics and Gynecology | Admitting: Obstetrics and Gynecology

## 2014-10-24 ENCOUNTER — Encounter (HOSPITAL_COMMUNITY): Payer: Self-pay

## 2014-10-24 DIAGNOSIS — J4521 Mild intermittent asthma with (acute) exacerbation: Secondary | ICD-10-CM

## 2014-10-24 DIAGNOSIS — O99513 Diseases of the respiratory system complicating pregnancy, third trimester: Secondary | ICD-10-CM | POA: Insufficient documentation

## 2014-10-24 DIAGNOSIS — J069 Acute upper respiratory infection, unspecified: Secondary | ICD-10-CM | POA: Insufficient documentation

## 2014-10-24 DIAGNOSIS — J45909 Unspecified asthma, uncomplicated: Secondary | ICD-10-CM | POA: Insufficient documentation

## 2014-10-24 DIAGNOSIS — Z3A30 30 weeks gestation of pregnancy: Secondary | ICD-10-CM | POA: Diagnosis not present

## 2014-10-24 DIAGNOSIS — R05 Cough: Secondary | ICD-10-CM | POA: Diagnosis present

## 2014-10-24 HISTORY — DX: Unspecified asthma, uncomplicated: J45.909

## 2014-10-24 LAB — URINALYSIS, ROUTINE W REFLEX MICROSCOPIC
Bilirubin Urine: NEGATIVE
Glucose, UA: NEGATIVE mg/dL
Ketones, ur: NEGATIVE mg/dL
LEUKOCYTES UA: NEGATIVE
NITRITE: NEGATIVE
PH: 6.5 (ref 5.0–8.0)
Protein, ur: NEGATIVE mg/dL
Specific Gravity, Urine: 1.01 (ref 1.005–1.030)
UROBILINOGEN UA: 0.2 mg/dL (ref 0.0–1.0)

## 2014-10-24 LAB — URINE MICROSCOPIC-ADD ON

## 2014-10-24 MED ORDER — IPRATROPIUM BROMIDE 0.02 % IN SOLN
0.5000 mg | Freq: Once | RESPIRATORY_TRACT | Status: AC
  Administered 2014-10-24: 0.5 mg via RESPIRATORY_TRACT
  Filled 2014-10-24: qty 2.5

## 2014-10-24 MED ORDER — ALBUTEROL SULFATE HFA 108 (90 BASE) MCG/ACT IN AERS: 2.0000 | INHALATION_SPRAY | Freq: Four times a day (QID) | RESPIRATORY_TRACT | Status: DC | PRN

## 2014-10-24 MED ORDER — ALBUTEROL SULFATE (2.5 MG/3ML) 0.083% IN NEBU
2.5000 mg | INHALATION_SOLUTION | Freq: Once | RESPIRATORY_TRACT | Status: AC
  Administered 2014-10-24: 2.5 mg via RESPIRATORY_TRACT
  Filled 2014-10-24: qty 3

## 2014-10-24 NOTE — MAU Note (Signed)
Coughing up yellow mucus since yesterday.  History of asthma several years ago.  Started having wheezing this morning. No leaking, no bleeding.  Baby moving well.

## 2014-10-24 NOTE — MAU Provider Note (Signed)
History     CSN: [id number]  Arrival date and time: 10/24/14 2113   First Stacey Graves Initiated Contact with Patient 10/24/14 2159      Chief Complaint  Patient presents with  . Cough   HPI  [name] is a 34 y.o. [id number] at [redacted]w[redacted]d who presents to MAU today with complaint of cough, wheezing and chest tightness since yesterday. She has not taken any medication for her symptoms. She states a history of asthma that has not required medication in years. She denies fever, sore throat, nasal congestion, contractions, vaginal bleeding, LOF or complications with the pregnancy. She reports good fetal movement. She states that her daughter has also been sick.   OB History    Gravida Para Term Preterm AB TAB SAB Ectopic Multiple Living   3 2 1 1      2       Past Medical History  Diagnosis Date  . No pertinent past medical history   . Preterm delivery   . Medical history non-contributory   . Asthma     Past Surgical History  Procedure Laterality Date  . Cesarean section  02/08/2011    Procedure: CESAREAN SECTION;  Surgeon: Caprice Beaver;  Location: [facility];  Service: Gynecology;  Laterality: N/A;    Family History  Problem Relation Age of Onset  . Hypertension Mother   . Asthma Father     Social History  Substance Use Topics  . Smoking status: Never Smoker   . Smokeless tobacco: Never Used  . Alcohol Use: No    Allergies: No Known Allergies  Prescriptions prior to admission  Medication Sig Dispense Refill Last Dose  . ibuprofen (ADVIL,MOTRIN) 600 MG tablet Take 1 tablet (600 mg total) by mouth every 6 (six) hours. 30 tablet 0 Past Month at Unknown time  . Prenatal Vit-Fe Fumarate-FA (PRENATAL MULTIVITAMIN) TABS tablet Take 1 tablet by mouth daily. 60 tablet 1 10/23/2014 at Unknown time    Review of Systems  Constitutional: Negative for fever and malaise/fatigue.  HENT: Negative for congestion, ear discharge, ear pain and sore throat.   Respiratory: Positive for cough,  sputum production and wheezing. Negative for shortness of breath.   Cardiovascular: Negative for chest pain.  Gastrointestinal: Negative for abdominal pain.  Genitourinary:       Neg - vaginal bleeding, discharge  Neurological: Negative for headaches.   Physical Exam   Blood pressure 108/60, pulse 97, temperature 97.7 F (36.5 C), temperature source Oral, resp. rate 16, last menstrual period 01/06/2014, SpO2 100 %, unknown if currently breastfeeding.  Physical Exam  Nursing note and vitals reviewed. Constitutional: She is oriented to person, place, and time. She appears well-developed and well-nourished. No distress.  HENT:  Head: Normocephalic and atraumatic.  Right Ear: Tympanic membrane, external ear and ear canal normal.  Left Ear: Tympanic membrane, external ear and ear canal normal.  Nose: No mucosal edema or rhinorrhea. Right sinus exhibits no maxillary sinus tenderness and no frontal sinus tenderness. Left sinus exhibits no maxillary sinus tenderness and no frontal sinus tenderness.  Mouth/Throat: Mucous membranes are normal. Posterior oropharyngeal erythema present. No oropharyngeal exudate, posterior oropharyngeal edema or tonsillar abscesses.  Cardiovascular: Normal rate, regular rhythm and normal heart sounds.   Respiratory: Effort normal and breath sounds normal. No respiratory distress. She has no wheezes. She has no rales. She exhibits no tenderness.  Mild restrictive breath sounds noted with deep inspiration  Lymphadenopathy:       Head (right side): No submental,  no submandibular and no tonsillar adenopathy present.       Head (left side): No submental, no submandibular and no tonsillar adenopathy present.    She has no cervical adenopathy.  Neurological: She is alert and oriented to person, place, and time.  Skin: Skin is warm and dry. No erythema.  Psychiatric: She has a normal mood and affect.    Results for orders placed or performed during the hospital encounter  of 10/24/14 (from the past 24 hour(s))  Urinalysis, Routine w reflex microscopic (not at [facility])     Status: Abnormal   Collection Time: 10/24/14  9:19 PM  Result Value Ref Range   Color, Urine YELLOW YELLOW   APPearance CLEAR CLEAR   Specific Gravity, Urine 1.010 1.005 - 1.030   pH 6.5 5.0 - 8.0   Glucose, UA NEGATIVE NEGATIVE mg/dL   Hgb urine dipstick TRACE (A) NEGATIVE   Bilirubin Urine NEGATIVE NEGATIVE   Ketones, ur NEGATIVE NEGATIVE mg/dL   Protein, ur NEGATIVE NEGATIVE mg/dL   Urobilinogen, UA 0.2 0.0 - 1.0 mg/dL   Nitrite NEGATIVE NEGATIVE   Leukocytes, UA NEGATIVE NEGATIVE  Urine microscopic-add on     Status: Abnormal   Collection Time: 10/24/14  9:19 PM  Result Value Ref Range   Squamous Epithelial / LPF FEW (A) RARE   WBC, UA 0-2 <3 WBC/hpf   Bacteria, UA FEW (A) RARE   Fetal Monitoring: Baseline: 140 bpm, moderate variability, + accelerations, no decelerations Contractions: none  MAU Course  Procedures None  MDM UA today Discussed patient with Dr. Claiborne Billings. Agrees with plan for nebulizer treatment in MAU and Rx for albuterol inhaler and list of OTC medications for symptomatic relief at home Patient should be instructed to call the office if symptoms persist or worsen or if she is having to use her inhaler > 3 times per week Assessment and Plan  A: Viral URI Asthma  P: Discharge home Rx for Albuterol inhaler given to patient List of OTC medications safe in pregnancy given with suggestions for symptomatic relief.  Warning signs for worsening condition discussed Patient advised to follow-up with Dr. Claiborne Billings as scheduled or sooner if symptoms persist or worsen Patient may return to MAU as needed or if her condition were to change or worsen   Marny Lowenstein, PA-C  10/24/2014, 11:12 PM

## 2014-10-24 NOTE — Discharge Instructions (Signed)
Asthma °Asthma is a condition of the lungs in which the airways tighten and narrow. Asthma can make it hard to breathe. Asthma cannot be cured, but medicine and lifestyle changes can help control it. Asthma may be started (triggered) by: °· Animal skin flakes (dander). °· Dust. °· Cockroaches. °· Pollen. °· Mold. °· Smoke. °· Cleaning products. °· Hair sprays or aerosol sprays. °· Paint fumes or strong smells. °· Cold air, weather changes, and winds. °· Crying or laughing hard. °· Stress. °· Certain medicines or drugs. °· Foods, such as dried fruit, potato chips, and sparkling grape juice. °· Infections or conditions (colds, flu). °· Exercise. °· Certain medical conditions or diseases. °· Exercise or tiring activities. °HOME CARE  °· Take medicine as told by your doctor. °· Use a peak flow meter as told by your doctor. A peak flow meter is a tool that measures how well the lungs are working. °· Record and keep track of the peak flow meter's readings. °· Understand and use the asthma action plan. An asthma action plan is a written plan for taking care of your asthma and treating your attacks. °· To help prevent asthma attacks: °· Do not smoke. Stay away from secondhand smoke. °· Change your heating and air conditioning filter often. °· Limit your use of fireplaces and wood stoves. °· Get rid of pests (such as roaches and mice) and their droppings. °· Throw away plants if you see mold on them. °· Clean your floors. Dust regularly. Use cleaning products that do not smell. °· Have someone vacuum when you are not home. Use a vacuum cleaner with a HEPA filter if possible. °· Replace carpet with wood, tile, or vinyl flooring. Carpet can trap animal skin flakes and dust. °· Use allergy-proof pillows, mattress covers, and box spring covers. °· Wash bed sheets and blankets every week in hot water and dry them in a dryer. °· Use blankets that are made of polyester or cotton. °· Clean bathrooms and kitchens with bleach. If  possible, have someone repaint the walls in these rooms with mold-resistant paint. Keep out of the rooms that are being cleaned and painted. °· Wash hands often. °GET HELP IF: °· You have make a whistling sound when breaking (wheeze), have shortness of breath, or have a cough even if taking medicine to prevent attacks. °· The colored mucus you cough up (sputum) is thicker than usual. °· The colored mucus you cough up changes from clear or white to yellow, green, gray, or bloody. °· You have problems from the medicine you are taking such as: °· A rash. °· Itching. °· Swelling. °· Trouble breathing. °· You need reliever medicines more than 2-3 times a week. °· Your peak flow measurement is still at 50-79% of your personal best after following the action plan for 1 hour. °· You have a fever. °GET HELP RIGHT AWAY IF:  °· You seem to be worse and are not responding to medicine during an asthma attack. °· You are short of breath even at rest. °· You get short of breath when doing very little activity. °· You have trouble eating, drinking, or talking. °· You have chest pain. °· You have a fast heartbeat. °· Your lips or fingernails start to turn blue. °· You are light-headed, dizzy, or faint. °· Your peak flow is less than 50% of your personal best. °MAKE SURE YOU:  °· Understand these instructions. °· Will watch your condition. °· Will get help right away if you   are not doing well or get worse. °Document Released: 07/12/2007 Document Revised: 06/09/2013 Document Reviewed: 08/22/2012 °ExitCare® Patient Information ©2015 [employer]. This information is not intended to replace advice given to you by your health care provider. Make sure you discuss any questions you have with your health care provider. ° °Upper Respiratory Infection, Adult °An upper respiratory infection (URI) is also known as the common cold. It is often caused by a type of germ (virus). Colds are easily spread (contagious). You can pass it to others by  kissing, coughing, sneezing, or drinking out of the same glass. Usually, you get better in 1 or 2 weeks.  °HOME CARE  °· Only take medicine as told by your doctor. °· Use a warm mist humidifier or breathe in steam from a hot shower. °· Drink enough water and fluids to keep your pee (urine) clear or pale yellow. °· Get plenty of rest. °· Return to work when your temperature is back to normal or as told by your doctor. You may use a face mask and wash your hands to stop your cold from spreading. °GET HELP RIGHT AWAY IF:  °· After the first few days, you feel you are getting worse. °· You have questions about your medicine. °· You have chills, shortness of breath, or brown or red spit (mucus). °· You have yellow or brown snot (nasal discharge) or pain in the face, especially when you bend forward. °· You have a fever, puffy (swollen) neck, pain when you swallow, or white spots in the back of your throat. °· You have a bad headache, ear pain, sinus pain, or chest pain. °· You have a high-pitched whistling sound when you breathe in and out (wheezing). °· You have a lasting cough or cough up blood. °· You have sore muscles or a stiff neck. °MAKE SURE YOU:  °· Understand these instructions. °· Will watch your condition. °· Will get help right away if you are not doing well or get worse. °Document Released: 07/12/2007 Document Revised: 04/17/2011 Document Reviewed: 04/30/2013 °ExitCare® Patient Information ©2015 [employer]. This information is not intended to replace advice given to you by your health care provider. Make sure you discuss any questions you have with your health care provider. ° °

## 2014-11-25 ENCOUNTER — Other Ambulatory Visit: Payer: Self-pay

## 2014-11-25 LAB — OB RESULTS CONSOLE GBS: GBS: NEGATIVE

## 2014-12-29 ENCOUNTER — Inpatient Hospital Stay (HOSPITAL_COMMUNITY)
Admission: AD | Admit: 2014-12-29 | Discharge: 2014-12-31 | DRG: 775 | Disposition: A | Discharge status: Home / Self Care | Payer: Medicaid Other | Source: Ambulatory Visit | Attending: Obstetrics and Gynecology | Admitting: Obstetrics and Gynecology

## 2014-12-29 ENCOUNTER — Encounter (HOSPITAL_COMMUNITY): Payer: Self-pay | Admitting: *Deleted

## 2014-12-29 ENCOUNTER — Inpatient Hospital Stay (HOSPITAL_COMMUNITY)
Admission: AD | Admit: 2014-12-29 | Discharge: 2014-12-29 | Disposition: A | Discharge status: Home / Self Care | Payer: Medicaid Other | Source: Ambulatory Visit | Attending: Obstetrics and Gynecology | Admitting: Obstetrics and Gynecology

## 2014-12-29 DIAGNOSIS — O26893 Other specified pregnancy related conditions, third trimester: Secondary | ICD-10-CM | POA: Diagnosis present

## 2014-12-29 DIAGNOSIS — Z3490 Encounter for supervision of normal pregnancy, unspecified, unspecified trimester: Secondary | ICD-10-CM

## 2014-12-29 DIAGNOSIS — Z3A40 40 weeks gestation of pregnancy: Secondary | ICD-10-CM

## 2014-12-29 DIAGNOSIS — O34219 Maternal care for unspecified type scar from previous cesarean delivery: Secondary | ICD-10-CM | POA: Diagnosis present

## 2014-12-29 LAB — CBC
HCT: 38.5 % (ref 36.0–46.0)
Hemoglobin: 12.9 g/dL (ref 12.0–15.0)
MCH: 30.6 pg (ref 26.0–34.0)
MCHC: 33.5 g/dL (ref 30.0–36.0)
MCV: 91.4 fL (ref 78.0–100.0)
PLATELETS: 278 10*3/uL (ref 150–400)
RBC: 4.21 MIL/uL (ref 3.87–5.11)
RDW: 14.1 % (ref 11.5–15.5)
WBC: 7.5 10*3/uL (ref 4.0–10.5)

## 2014-12-29 LAB — TYPE AND SCREEN
ABO/RH(D): A NEG
Antibody Screen: NEGATIVE

## 2014-12-29 MED ORDER — OXYTOCIN 40 UNITS IN LACTATED RINGERS INFUSION - SIMPLE MED
1.0000 m[IU]/min | INTRAVENOUS | Status: DC
  Administered 2014-12-29: 2 m[IU]/min via INTRAVENOUS
  Filled 2014-12-29: qty 1000

## 2014-12-29 MED ORDER — OXYTOCIN BOLUS FROM INFUSION
500.0000 mL | INTRAVENOUS | Status: DC
Start: 2014-12-29 — End: 2014-12-30
  Administered 2014-12-30: 500 mL via INTRAVENOUS

## 2014-12-29 MED ORDER — OXYTOCIN 40 UNITS IN LACTATED RINGERS INFUSION - SIMPLE MED: 62.5000 mL/h | INTRAVENOUS | Status: DC

## 2014-12-29 MED ORDER — ZOLPIDEM TARTRATE 5 MG PO TABS: 5.0000 mg | ORAL_TABLET | Freq: Every evening | ORAL | Status: DC | PRN

## 2014-12-29 MED ORDER — ACETAMINOPHEN 325 MG PO TABS: 650.0000 mg | ORAL_TABLET | ORAL | Status: DC | PRN

## 2014-12-29 MED ORDER — OXYCODONE-ACETAMINOPHEN 5-325 MG PO TABS
1.0000 | ORAL_TABLET | ORAL | Status: DC | PRN
  Administered 2014-12-30: 1 via ORAL
  Filled 2014-12-29: qty 1

## 2014-12-29 MED ORDER — BUTORPHANOL TARTRATE 1 MG/ML IJ SOLN: 1.0000 mg | INTRAMUSCULAR | Status: DC | PRN

## 2014-12-29 MED ORDER — CITRIC ACID-SODIUM CITRATE 334-500 MG/5ML PO SOLN: 30.0000 mL | ORAL | Status: DC | PRN

## 2014-12-29 MED ORDER — ONDANSETRON HCL 4 MG/2ML IJ SOLN: 4.0000 mg | Freq: Four times a day (QID) | INTRAMUSCULAR | Status: DC | PRN

## 2014-12-29 MED ORDER — OXYCODONE-ACETAMINOPHEN 5-325 MG PO TABS: 2.0000 | ORAL_TABLET | ORAL | Status: DC | PRN

## 2014-12-29 MED ORDER — LACTATED RINGERS IV SOLN: 500.0000 mL | INTRAVENOUS | Status: DC | PRN

## 2014-12-29 MED ORDER — LACTATED RINGERS IV SOLN
INTRAVENOUS | Status: DC
  Administered 2014-12-29 (×2): via INTRAVENOUS

## 2014-12-29 MED ORDER — TERBUTALINE SULFATE 1 MG/ML IJ SOLN
0.2500 mg | Freq: Once | INTRAMUSCULAR | Status: DC | PRN
  Filled 2014-12-29: qty 1

## 2014-12-29 MED ORDER — LIDOCAINE HCL (PF) 1 % IJ SOLN
30.0000 mL | INTRAMUSCULAR | Status: DC | PRN
  Administered 2014-12-30: 30 mL via SUBCUTANEOUS
  Filled 2014-12-29: qty 30

## 2014-12-29 NOTE — Progress Notes (Signed)
   Chief complaint: Nonreactive NST History of present illness: 34 year old gravida 3 para 1102 at 40 weeks 0 days presents to maternity admissions from the office for a nonreactive nonstress test. In maternity admissions the patient's nonstress test was reactive. However, given the fact that she is [redacted] weeks gestation with advanced cervical dilation I recommended proceeding with induction of labor. Risks/benefits/alternatives were reviewed with the patient and she agrees on admission. Prior to being admitted she asked if she could go home to see her children and eats dinner and I agreed. O:  Filed Vitals:   12/29/14 1703  BP: 101/68  Pulse: 64  Temp: 98 F (36.7 C)  TempSrc: Oral  Resp: 16  SpO2: 100%   FHR 140 reactive, category 1 tracing Cvx 3/50/-2 vertex toco irregular  A/P: 1) Patient will go home and then return for direct admission for induction of labor.

## 2014-12-29 NOTE — Discharge Instructions (Signed)
Fetal Movement Counts  Patient Name: __________________________________________________ Patient Due Date: ____________________  Performing a fetal movement count is highly recommended in high-risk pregnancies, but it is good for every pregnant woman to do. Your health care provider may ask you to start counting fetal movements at 28 weeks of the pregnancy. Fetal movements often increase:  · After eating a full meal.  · After physical activity.  · After eating or drinking something sweet or cold.  · At rest.  Pay attention to when you feel the baby is most active. This will help you notice a pattern of your baby's sleep and wake cycles and what factors contribute to an increase in fetal movement. It is important to perform a fetal movement count at the same time each day when your baby is normally most active.   HOW TO COUNT FETAL MOVEMENTS  1. Find a quiet and comfortable area to sit or lie down on your left side. Lying on your left side provides the best blood and oxygen circulation to your baby.  2. Write down the day and time on a sheet of paper or in a journal.  3. Start counting kicks, flutters, swishes, rolls, or jabs in a 2-hour period. You should feel at least 10 movements within 2 hours.  4. If you do not feel 10 movements in 2 hours, wait 2-3 hours and count again. Look for a change in the pattern or not enough counts in 2 hours.  SEEK MEDICAL CARE IF:  · You feel less than 10 counts in 2 hours, tried twice.  · There is no movement in over an hour.  · The pattern is changing or taking longer each day to reach 10 counts in 2 hours.  · You feel the baby is not moving as he or she usually does.  Date: ____________ Movements: ____________ Start time: ____________ Finish time: ____________   Date: ____________ Movements: ____________ Start time: ____________ Finish time: ____________  Date: ____________ Movements: ____________ Start time: ____________ Finish time: ____________  Date: ____________ Movements:  ____________ Start time: ____________ Finish time: ____________  Date: ____________ Movements: ____________ Start time: ____________ Finish time: ____________  Date: ____________ Movements: ____________ Start time: ____________ Finish time: ____________  Date: ____________ Movements: ____________ Start time: ____________ Finish time: ____________  Date: ____________ Movements: ____________ Start time: ____________ Finish time: ____________   Date: ____________ Movements: ____________ Start time: ____________ Finish time: ____________  Date: ____________ Movements: ____________ Start time: ____________ Finish time: ____________  Date: ____________ Movements: ____________ Start time: ____________ Finish time: ____________  Date: ____________ Movements: ____________ Start time: ____________ Finish time: ____________  Date: ____________ Movements: ____________ Start time: ____________ Finish time: ____________  Date: ____________ Movements: ____________ Start time: ____________ Finish time: ____________  Date: ____________ Movements: ____________ Start time: ____________ Finish time: ____________   Date: ____________ Movements: ____________ Start time: ____________ Finish time: ____________  Date: ____________ Movements: ____________ Start time: ____________ Finish time: ____________  Date: ____________ Movements: ____________ Start time: ____________ Finish time: ____________  Date: ____________ Movements: ____________ Start time: ____________ Finish time: ____________  Date: ____________ Movements: ____________ Start time: ____________ Finish time: ____________  Date: ____________ Movements: ____________ Start time: ____________ Finish time: ____________  Date: ____________ Movements: ____________ Start time: ____________ Finish time: ____________   Date: ____________ Movements: ____________ Start time: ____________ Finish time: ____________  Date: ____________ Movements: ____________ Start time: ____________ Finish  time: ____________  Date: ____________ Movements: ____________ Start time: ____________ Finish time: ____________  Date: ____________ Movements: ____________ Start time:   ____________ Finish time: ____________  Date: ____________ Movements: ____________ Start time: ____________ Finish time: ____________  Date: ____________ Movements: ____________ Start time: ____________ Finish time: ____________  Date: ____________ Movements: ____________ Start time: ____________ Finish time: ____________   Date: ____________ Movements: ____________ Start time: ____________ Finish time: ____________  Date: ____________ Movements: ____________ Start time: ____________ Finish time: ____________  Date: ____________ Movements: ____________ Start time: ____________ Finish time: ____________  Date: ____________ Movements: ____________ Start time: ____________ Finish time: ____________  Date: ____________ Movements: ____________ Start time: ____________ Finish time: ____________  Date: ____________ Movements: ____________ Start time: ____________ Finish time: ____________  Date: ____________ Movements: ____________ Start time: ____________ Finish time: ____________   Date: ____________ Movements: ____________ Start time: ____________ Finish time: ____________  Date: ____________ Movements: ____________ Start time: ____________ Finish time: ____________  Date: ____________ Movements: ____________ Start time: ____________ Finish time: ____________  Date: ____________ Movements: ____________ Start time: ____________ Finish time: ____________  Date: ____________ Movements: ____________ Start time: ____________ Finish time: ____________  Date: ____________ Movements: ____________ Start time: ____________ Finish time: ____________  Date: ____________ Movements: ____________ Start time: ____________ Finish time: ____________   Date: ____________ Movements: ____________ Start time: ____________ Finish time: ____________  Date: ____________  Movements: ____________ Start time: ____________ Finish time: ____________  Date: ____________ Movements: ____________ Start time: ____________ Finish time: ____________  Date: ____________ Movements: ____________ Start time: ____________ Finish time: ____________  Date: ____________ Movements: ____________ Start time: ____________ Finish time: ____________  Date: ____________ Movements: ____________ Start time: ____________ Finish time: ____________  Date: ____________ Movements: ____________ Start time: ____________ Finish time: ____________   Date: ____________ Movements: ____________ Start time: ____________ Finish time: ____________  Date: ____________ Movements: ____________ Start time: ____________ Finish time: ____________  Date: ____________ Movements: ____________ Start time: ____________ Finish time: ____________  Date: ____________ Movements: ____________ Start time: ____________ Finish time: ____________  Date: ____________ Movements: ____________ Start time: ____________ Finish time: ____________  Date: ____________ Movements: ____________ Start time: ____________ Finish time: ____________     This information is not intended to replace advice given to you by your health care provider. Make sure you discuss any questions you have with your health care provider.     Document Released: 02/22/2006 Document Revised: 02/13/2014 Document Reviewed: 11/20/2011  Elsevier Interactive Patient Education ©2016 [employer]

## 2014-12-29 NOTE — MAU Note (Signed)
Sent from office, routine NST as pt is due today, was non-reactive.  States having a little pain. Plans to VBAC

## 2014-12-30 ENCOUNTER — Encounter (HOSPITAL_COMMUNITY): Payer: Self-pay | Admitting: Anesthesiology

## 2014-12-30 ENCOUNTER — Encounter (HOSPITAL_COMMUNITY): Payer: Self-pay | Admitting: *Deleted

## 2014-12-30 LAB — CBC
HCT: 36.4 % (ref 36.0–46.0)
Hemoglobin: 11.9 g/dL — ABNORMAL LOW (ref 12.0–15.0)
MCH: 30.1 pg (ref 26.0–34.0)
MCHC: 32.7 g/dL (ref 30.0–36.0)
MCV: 91.9 fL (ref 78.0–100.0)
PLATELETS: 261 10*3/uL (ref 150–400)
RBC: 3.96 MIL/uL (ref 3.87–5.11)
RDW: 14 % (ref 11.5–15.5)
WBC: 13.5 10*3/uL — ABNORMAL HIGH (ref 4.0–10.5)

## 2014-12-30 LAB — RPR: RPR: NONREACTIVE

## 2014-12-30 MED ORDER — ONDANSETRON HCL 4 MG/2ML IJ SOLN: 4.0000 mg | INTRAMUSCULAR | Status: DC | PRN

## 2014-12-30 MED ORDER — OXYCODONE-ACETAMINOPHEN 5-325 MG PO TABS: 1.0000 | ORAL_TABLET | ORAL | Status: DC | PRN

## 2014-12-30 MED ORDER — FENTANYL 2.5 MCG/ML BUPIVACAINE 1/10 % EPIDURAL INFUSION (WH - ANES)
14.0000 mL/h | INTRAMUSCULAR | Status: DC | PRN
  Filled 2014-12-30: qty 125

## 2014-12-30 MED ORDER — PRENATAL MULTIVITAMIN CH
1.0000 | ORAL_TABLET | Freq: Every day | ORAL | Status: DC
  Administered 2014-12-30: 1 via ORAL
  Filled 2014-12-30: qty 1

## 2014-12-30 MED ORDER — RHO D IMMUNE GLOBULIN 1500 UNIT/2ML IJ SOSY
300.0000 ug | PREFILLED_SYRINGE | Freq: Once | INTRAMUSCULAR | Status: AC
  Administered 2014-12-30: 300 ug via INTRAVENOUS
  Filled 2014-12-30: qty 2

## 2014-12-30 MED ORDER — OXYCODONE-ACETAMINOPHEN 5-325 MG PO TABS: 2.0000 | ORAL_TABLET | ORAL | Status: DC | PRN

## 2014-12-30 MED ORDER — METHYLERGONOVINE MALEATE 0.2 MG PO TABS: 0.2000 mg | ORAL_TABLET | ORAL | Status: DC | PRN

## 2014-12-30 MED ORDER — DIPHENHYDRAMINE HCL 50 MG/ML IJ SOLN: 12.5000 mg | INTRAMUSCULAR | Status: DC | PRN

## 2014-12-30 MED ORDER — DIPHENHYDRAMINE HCL 25 MG PO CAPS: 25.0000 mg | ORAL_CAPSULE | Freq: Four times a day (QID) | ORAL | Status: DC | PRN

## 2014-12-30 MED ORDER — METHYLERGONOVINE MALEATE 0.2 MG/ML IJ SOLN: 0.2000 mg | INTRAMUSCULAR | Status: DC | PRN

## 2014-12-30 MED ORDER — DIBUCAINE 1 % RE OINT: 1.0000 "application " | TOPICAL_OINTMENT | RECTAL | Status: DC | PRN

## 2014-12-30 MED ORDER — ZOLPIDEM TARTRATE 5 MG PO TABS: 5.0000 mg | ORAL_TABLET | Freq: Every evening | ORAL | Status: DC | PRN

## 2014-12-30 MED ORDER — SIMETHICONE 80 MG PO CHEW: 80.0000 mg | CHEWABLE_TABLET | ORAL | Status: DC | PRN

## 2014-12-30 MED ORDER — ALBUTEROL SULFATE (2.5 MG/3ML) 0.083% IN NEBU: 3.0000 mL | INHALATION_SOLUTION | Freq: Four times a day (QID) | RESPIRATORY_TRACT | Status: DC | PRN

## 2014-12-30 MED ORDER — WITCH HAZEL-GLYCERIN EX PADS: 1.0000 "application " | MEDICATED_PAD | CUTANEOUS | Status: DC | PRN

## 2014-12-30 MED ORDER — IBUPROFEN 600 MG PO TABS
600.0000 mg | ORAL_TABLET | Freq: Four times a day (QID) | ORAL | Status: DC
  Administered 2014-12-30 – 2014-12-31 (×5): 600 mg via ORAL
  Filled 2014-12-30 (×5): qty 1

## 2014-12-30 MED ORDER — ACETAMINOPHEN 325 MG PO TABS: 650.0000 mg | ORAL_TABLET | ORAL | Status: DC | PRN

## 2014-12-30 MED ORDER — SENNOSIDES-DOCUSATE SODIUM 8.6-50 MG PO TABS
2.0000 | ORAL_TABLET | ORAL | Status: DC
  Administered 2014-12-30: 2 via ORAL
  Filled 2014-12-30: qty 2

## 2014-12-30 MED ORDER — BENZOCAINE-MENTHOL 20-0.5 % EX AERO
1.0000 "application " | INHALATION_SPRAY | CUTANEOUS | Status: DC | PRN
  Administered 2014-12-30: 1 via TOPICAL
  Filled 2014-12-30: qty 56

## 2014-12-30 MED ORDER — LANOLIN HYDROUS EX OINT: TOPICAL_OINTMENT | CUTANEOUS | Status: DC | PRN

## 2014-12-30 MED ORDER — EPHEDRINE 5 MG/ML INJ
10.0000 mg | INTRAVENOUS | Status: DC | PRN
  Filled 2014-12-30: qty 2

## 2014-12-30 MED ORDER — PHENYLEPHRINE 40 MCG/ML (10ML) SYRINGE FOR IV PUSH (FOR BLOOD PRESSURE SUPPORT)
80.0000 ug | PREFILLED_SYRINGE | INTRAVENOUS | Status: DC | PRN
Start: 2014-12-30 — End: 2014-12-30
  Filled 2014-12-30: qty 20
  Filled 2014-12-30: qty 2

## 2014-12-30 MED ORDER — ONDANSETRON HCL 4 MG PO TABS: 4.0000 mg | ORAL_TABLET | ORAL | Status: DC | PRN

## 2014-12-30 NOTE — Lactation Note (Signed)
This note was copied from the chart of Stacey Graves. Lactation Consultation Note  Patient Name: Stacey Orpah CobbDalya Woodbury Today's Date: 12/30/2014 Reason for consult: Initial assessment Baby at 15 hr of life and mom reports that he latches well most of the time, sometimes he is sleepy. She bf her older daughters for 1.5 yr each with no issues. She is worried that the baby has been spitting up clear bubbles. Discussed baby belly size, feeding frequency, breast changes, and nipple care. Given lactation handouts and she is aware of OP services along with the support group.   Maternal Data Has patient been taught Hand Expression?: Yes Does the patient have breastfeeding experience prior to this delivery?: Yes  Feeding Feeding Type: Breast Fed Length of feed: 0 min (too sleepy)  LATCH Score/Interventions                      Lactation Tools Discussed/Used WIC Program: Yes [facility]([place]   Consult Status Consult Status: Follow-up Date: 12/31/14 Follow-up type: In-patient    Rulon Eisenmengerlizabeth E Ceceilia Cephus 12/30/2014, 6:33 PM

## 2014-12-30 NOTE — Progress Notes (Signed)
UR chart review completed.  

## 2014-12-31 LAB — RH IG WORKUP (INCLUDES ABO/RH)
ABO/RH(D): A NEG
Fetal Screen: NEGATIVE
GESTATIONAL AGE(WKS): 40.1
Unit division: 0

## 2014-12-31 NOTE — Lactation Note (Signed)
This note was copied from the chart of Stacey Graves. Lactation Consultation Note  Patient Name: Stacey Orpah CobbDalya Favela [id number]'s Date: 12/31/2014 Reason for consult: Follow-up assessment   Follow-up consult at 5431 hrs old.  Ga 40.1; BW 6 lbs, 13.2 oz.  Mom is an experienced breastfeeder.  Infant's weight loss last night @~23 hrs old was 5%. Infant has breastfed x9 (10-20 min) + attempt x3 (0 min); voids-4; stools-2 in past 24 hrs. RN stated she has seen colostrum easily flowing from breast with compressions & hand expression. Infant was on breast when LC entered room.  Semi-shallow latch in cradle hold with body positioned toward ceiling and head turned. Mom stated she knew how to hand express colostrum.   LC encouraged mom to turn infant's body toward her body and tuck infant in closer to her.  Infant did have flanged lips and wide latch.   After re-positioning infant's body, he began to suck in a more rhythmical pattern; several swallows heard. Mom stated she does not have a pump at home. Hand pump given for discharge to home; demonstrated use. Informed mom of hospital support group and outpatient services.  Encouraged to call for questions or concerns if needed after discharge.     Maternal Data Does the patient have breastfeeding experience prior to this delivery?: Yes  Feeding Feeding Type: Breast Fed  LATCH Score/Interventions Latch: Grasps breast easily, tongue down, lips flanged, rhythmical sucking. Intervention(s): Adjust position;Assist with latch;Breast massage;Breast compression  Audible Swallowing: Spontaneous and intermittent Intervention(s): Skin to skin;Hand expression  Type of Nipple: Everted at rest and after stimulation  Comfort (Breast/Nipple): Filling, red/small blisters or bruises, mild/mod discomfort  Problem noted: Mild/Moderate discomfort Interventions (Mild/moderate discomfort): Hand expression  Hold (Positioning): Assistance needed to correctly position  infant at breast and maintain latch. Intervention(s): Breastfeeding basics reviewed;Support Pillows;Position options;Skin to skin  LATCH Score: 8  Lactation Tools Discussed/Used Pump Review: Setup, frequency, and cleaning   Consult Status Consult Status: Complete    Lendon KaVann, [name] 12/31/2014, 10:40 AM

## 2014-12-31 NOTE — Discharge Summary (Signed)
Obstetric Discharge Summary Reason for Admission: induction of labor, NST non-reactive in office, term, VBAC Prenatal Procedures: ultrasound Intrapartum Procedures: spontaneous vaginal delivery Postpartum Procedures: none Complications-Operative and Postpartum: none HEMOGLOBIN  Date Value Ref Range Status  12/30/2014 11.9* 12.0 - 15.0 g/dL Final   HCT  Date Value Ref Range Status  12/30/2014 36.4 36.0 - 46.0 % Final    Physical Exam:  General: alert and cooperative Lochia: appropriate Uterine Fundus: firm DVT Evaluation: No evidence of DVT seen on physical exam.  Discharge Diagnoses: Term Pregnancy-delivered  Discharge Information: Date: 12/31/2014 Activity: pelvic rest Diet: routine Medications: PNV and Ibuprofen Condition: stable Instructions: refer to practice specific booklet Discharge to: home Follow-up Information    Follow up with Almon HerculesOSS,KENDRA H., MD In 4 weeks.   Specialty:  Obstetrics and Gynecology   Contact information:   [address] [place] [address] [phone]       Newborn Data: Live born female  Birth Weight: 6 lb 13.2 oz (3096 g) APGAR: 9, 9  Home with mother.  Stacey Graves, Stacey Graves 12/31/2014, 11:20 AM

## 2015-01-06 NOTE — H&P (Signed)
Stacey Graves is a 34 y.o. female presenting for induction  34 yo G3P2002 was seen in MAU for NR NST in office. NST  In MAU was categorey 1, however given postterm and favorable cervix the patient was admitted for IOL. She has a h/o a prior c/s for placental abruption and a successful vbac History OB History    Gravida Para Term Preterm AB TAB SAB Ectopic Multiple Living   3 3 2 1      0 3     Past Medical History  Diagnosis Date  . No pertinent past medical history   . Preterm delivery   . Medical history non-contributory   . Asthma    Past Surgical History  Procedure Laterality Date  . Cesarean section  02/08/2011    Procedure: CESAREAN SECTION;  Surgeon: Caprice Beaverobert M Wein;  Location: [facility];  Service: Gynecology;  Laterality: N/A;   Family History: family history includes Asthma in her father; Hypertension in her mother. Social History:  reports that she has never smoked. She has never used smokeless tobacco. She reports that she does not drink alcohol or use illicit drugs.   Prenatal Transfer Tool  Maternal Diabetes: No Genetic Screening: Declined Maternal Ultrasounds/Referrals: Normal Fetal Ultrasounds or other Referrals:  None Maternal Substance Abuse:  No Significant Maternal Medications:  None Significant Maternal Lab Results:  None Other Comments:  None  ROS  Dilation: 10 Effacement (%): 100 Station: +2, +3 Exam by:: E. Siska, RN Blood pressure 108/64, pulse 62, temperature 97.9 F (36.6 C), temperature source Oral, resp. rate 18, height 5\' 3"  (1.6 m), weight 131 lb (59.421 kg), last menstrual period 01/06/2014, unknown if currently breastfeeding. Exam Physical Exam  Prenatal labs: ABO, Rh: --/--/A NEG (11/23 16100635) Antibody: NEG (11/22 2140) Rubella: Immune (05/12 0000) RPR: Non Reactive (11/22 2140)  HBsAg: Negative (05/12 0000)  HIV: Non-reactive (05/12 0000)  GBS: Negative (10/19 0000)   Assessment/Plan: 1) Admit 2) Pit 3) Epidural   Stacey Graves  H. 01/06/2015, 10:44 AM

## 2015-04-26 IMAGING — US US OB FOLLOW-UP
1 series · 12 of 28 positions shown · non-contrast
Comparison: none

[Series 1: us ob follow-up · 12 of 29 slices shown]
[im 2/29]
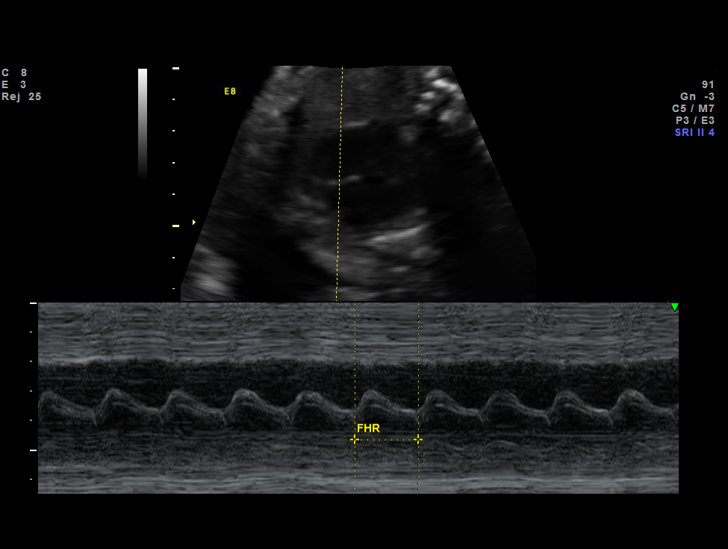
[im 4/29]
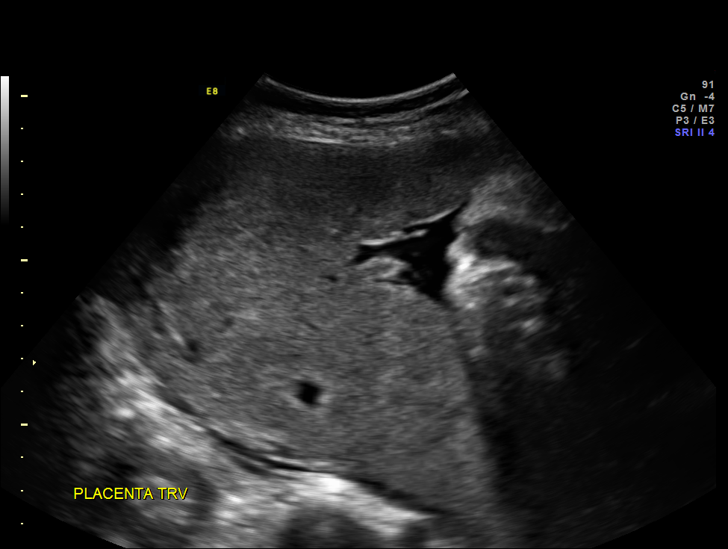
[im 6/29]
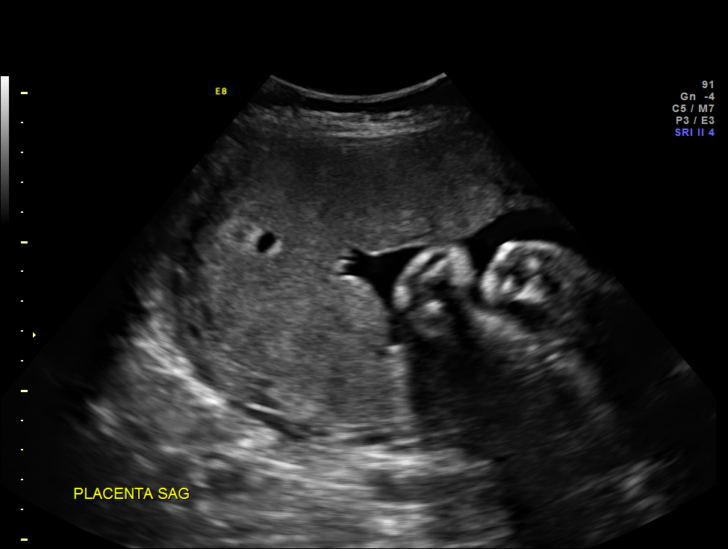
[im 9/29]
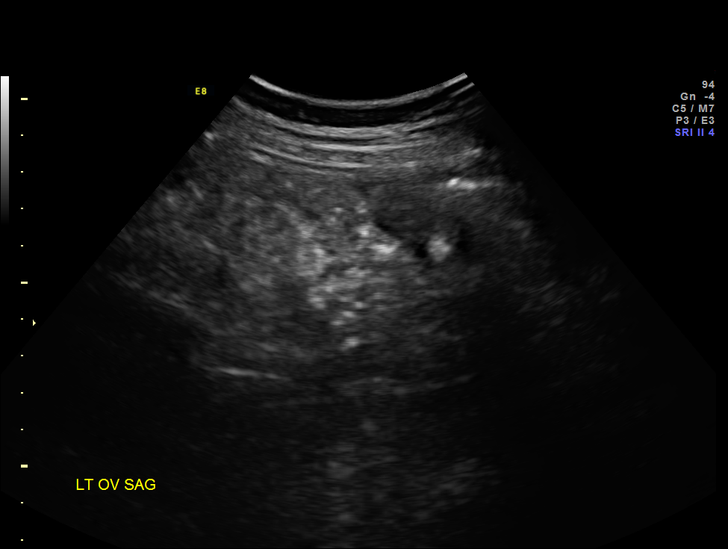
[im 11/29]
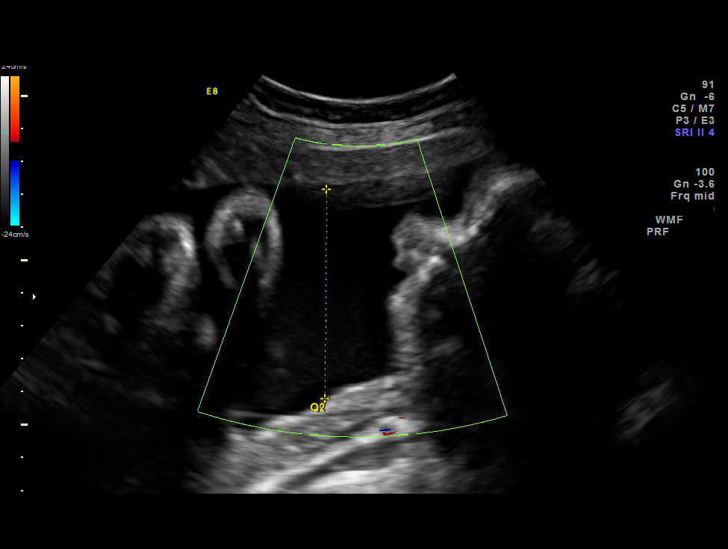
[im 13/29]
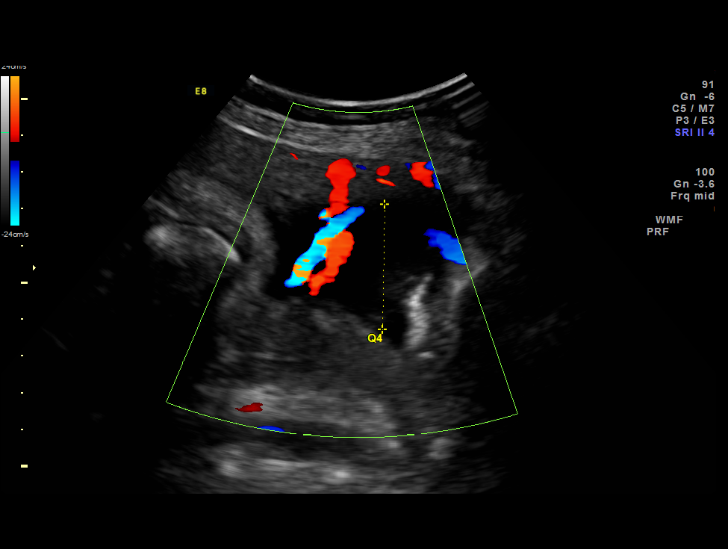
[im 16/29]
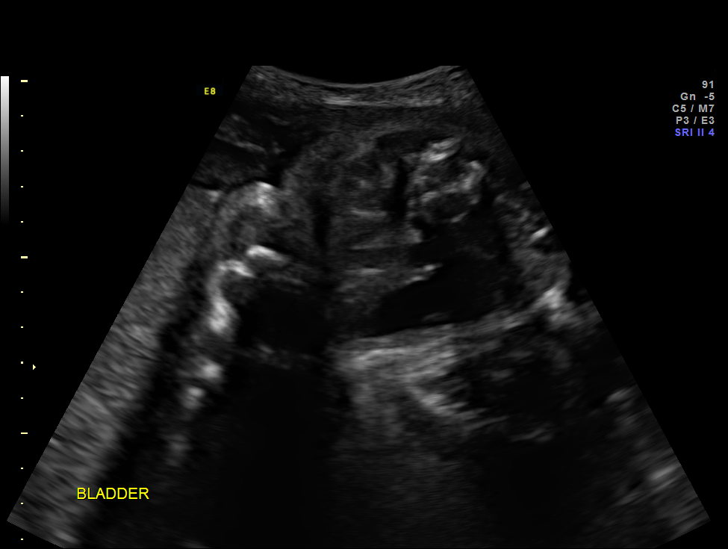
[im 18/29]
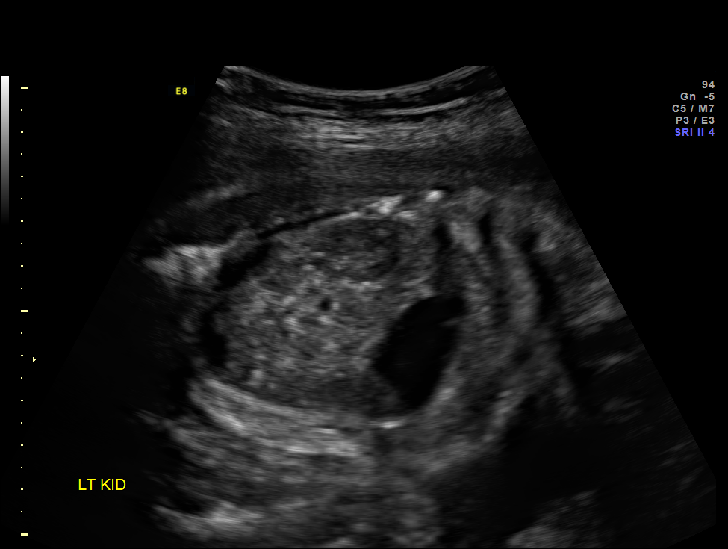
[im 20/29]
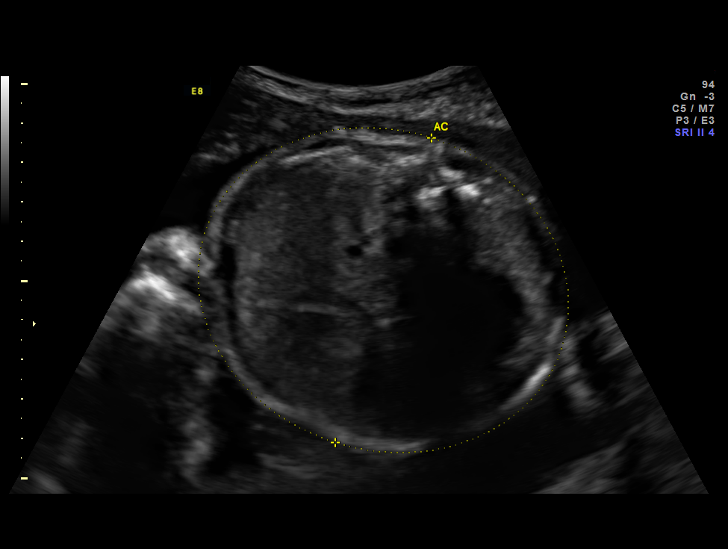
[im 23/29]
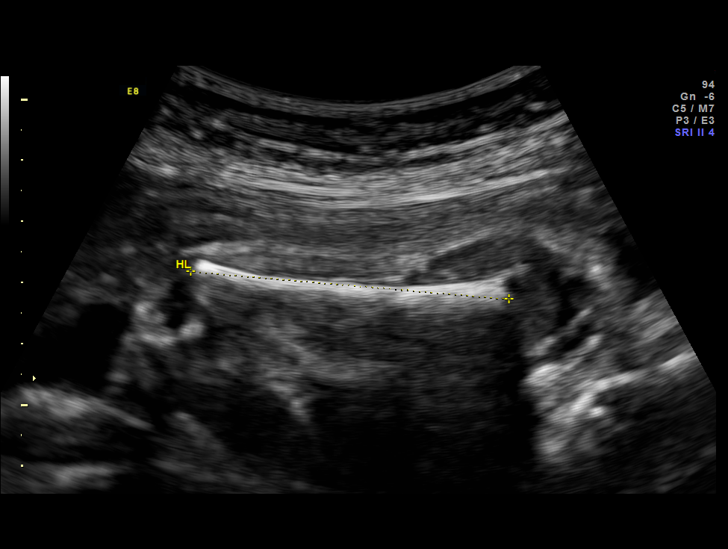
[im 25/29]
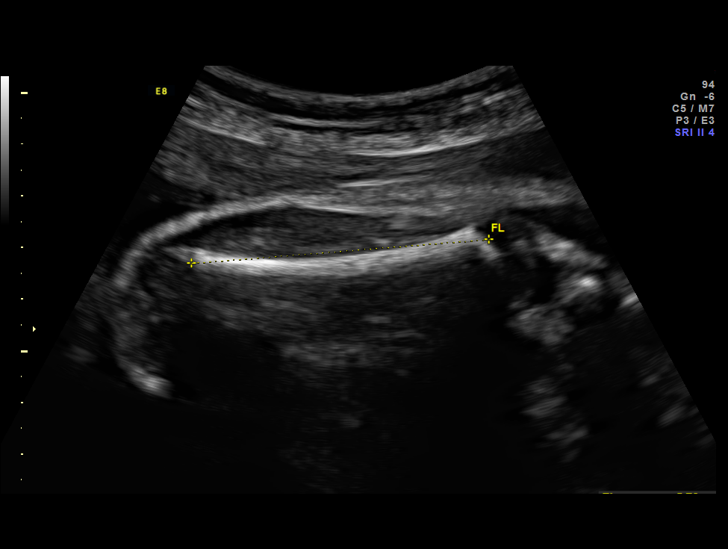
[im 27/29]
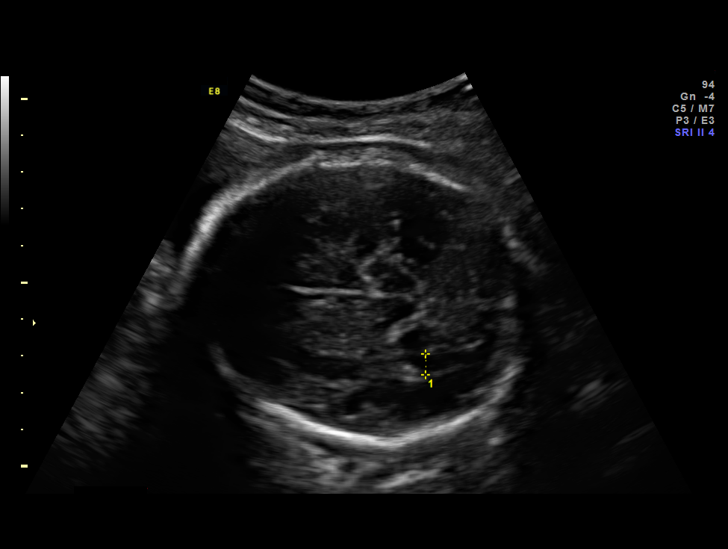

[12 of 28 positions shown; findings below may reference images not displayed]

OBSTETRICS REPORT
                      (Signed Final 01/08/2013 [DATE])

Service(s) Provided

 US OB FOLLOW UP                                       76816.1
Indications

 Size less than dates (Small for gestational [AGE]
 FGR)
 Poor obstetric history: Previous preterm delivery
 (35 weeks, 4lb 7oz)
 History of cesarean delivery, currently pregnant      654.20,
 Rh negative
Fetal Evaluation

 Num Of Fetuses:    1
 Fetal Heart Rate:  159                          bpm
 Cardiac Activity:  Observed
 Presentation:      Cephalic
 Placenta:          Fundal, above cervical os
 P. Cord            Previously Visualized
 Insertion:

 Amniotic Fluid
 AFI FV:      Subjectively within normal limits
 AFI Sum:     15.43   cm       55  %Tile     Larg Pckt:    6.37  cm
 RUQ:   3.47    cm   RLQ:    3.4    cm    LUQ:   6.37    cm   LLQ:    2.19   cm
Biometry

 BPD:       77  mm     G. Age:  30w 6d                CI:         76.8   70 - 86
 OFD:    100.2  mm                                    FL/HC:      20.4   19.9 -

 HC:     283.1  mm     G. Age:  31w 0d      < 3  %    HC/AC:      1.03   0.96 -

 AC:     274.4  mm     G. Age:  31w 4d       14  %    FL/BPD:     75.1   71 - 87
 FL:      57.8  mm     G. Age:  30w 2d      < 3  %    FL/AC:      21.1   20 - 24
 HUM:     52.3  mm     G. Age:  30w 3d        8  %

 Est. FW:    0945  gm    3 lb 11 oz      23  %
Gestational Age

 LMP:           33w 0d        Date:  05/22/12                 EDD:   02/26/13
 U/S Today:     31w 0d                                        EDD:   03/12/13
 Best:          33w 0d     Det. By:  LMP  (05/22/12)          EDD:   02/26/13
Anatomy

 Cranium:          Appears normal         Aortic Arch:      Previously seen
 Fetal Cavum:      Appears normal         Ductal Arch:      Previously seen
 Ventricles:       Appears normal         Diaphragm:        Previously seen
 Choroid Plexus:   Previously seen        Stomach:          Appears normal, left
                                                            sided
 Cerebellum:       Previously seen        Abdomen:          Appears normal
 Posterior Fossa:  Previously seen        Abdominal Wall:   Previously seen
 Nuchal Fold:      Not applicable (>20    Cord Vessels:     Previously seen
                   wks GA)
 Face:             Orbits and profile     Kidneys:          Appear normal
                   previously seen
 Lips:             Previously seen        Bladder:          Appears normal
 Heart:            Previously seen        Spine:            Previously seen
 RVOT:             Previously seen        Lower             Previously seen
                                          Extremities:
 LVOT:             Previously seen        Upper             Previously seen
                                          Extremities:

 Other:  Fetus appears to be a male. Heels and 5th digit previously seen.
Cervix Uterus Adnexa

 Cervix:       Not visualized (advanced GA >26wks)
 Left Ovary:    Within normal limits.
 Right Ovary:   Within normal limits.

 Adnexa:     No abnormality visualized.
Impression

 IUP at 33+0 weeks
 Normal interval anatomy; anatomic survey complete
 Normal amniotic fluid volume
 Appropriate interval growth with EFW at the 23rd %tile; all
 measurements remaining on same growth curve; probably
 constitutionally small
Recommendations

 Follow-up ultrasound for growth in 4 weeks

 questions or concerns.

## 2015-05-24 IMAGING — US US OB FOLLOW-UP
1 series · 12 of 28 positions shown · non-contrast
Comparison: none

[Series 1: us ob follow-up · 0.23mm/px · 12 of 31 slices shown]
[im 2/31]
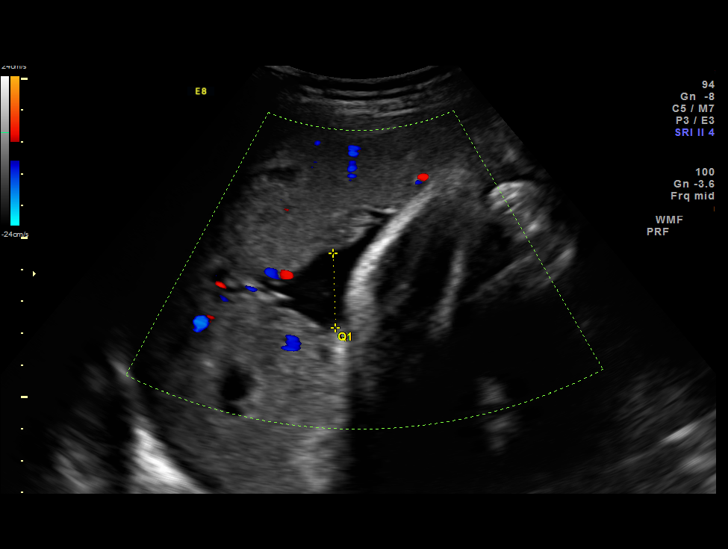
[im 4/31]
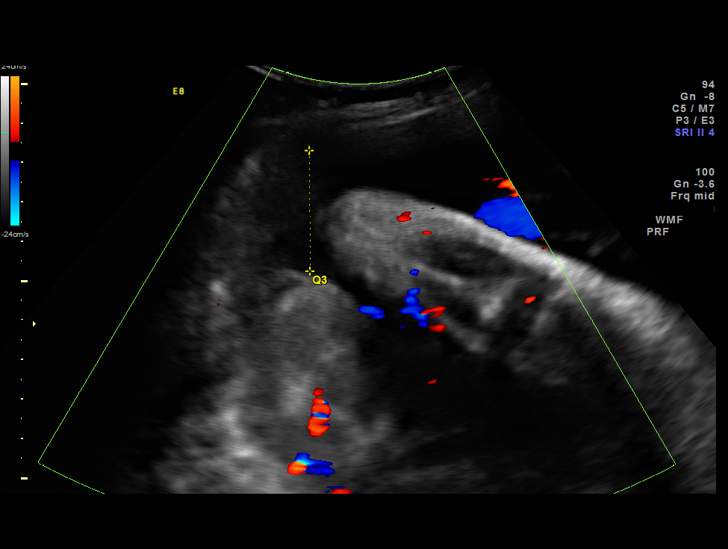
[im 6/31]
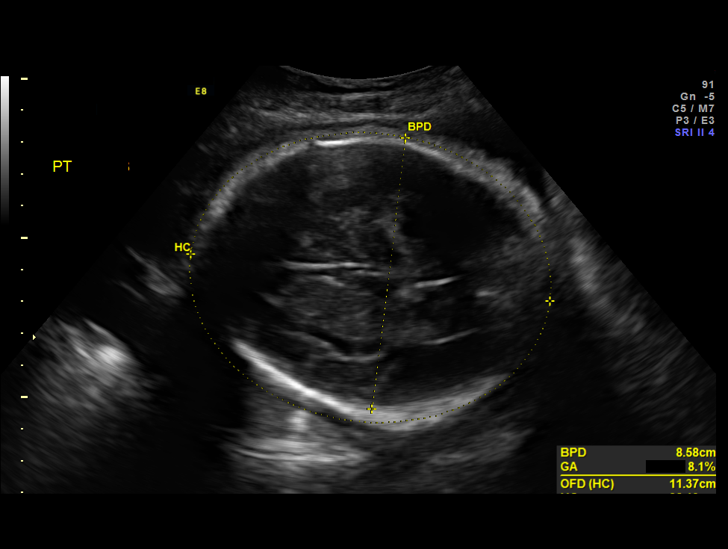
[im 9/31]
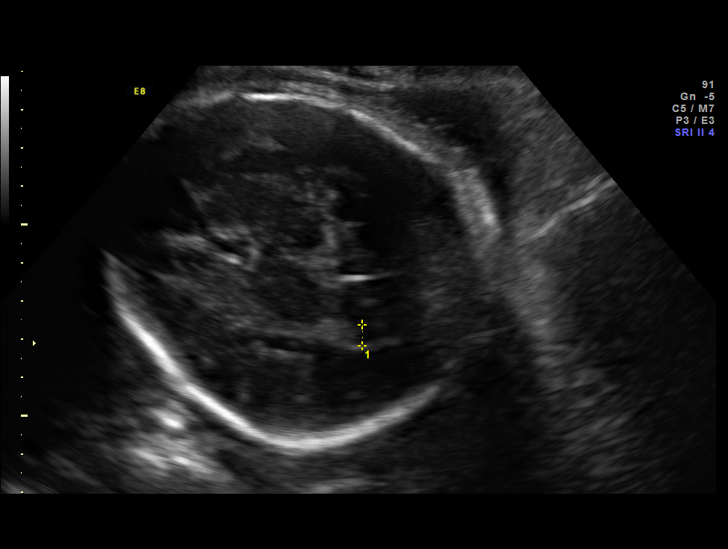
[im 12/31]
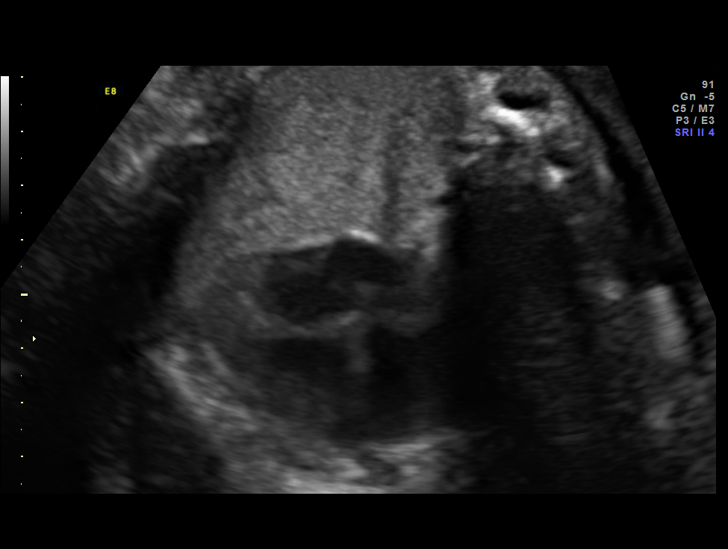
[im 14/31]
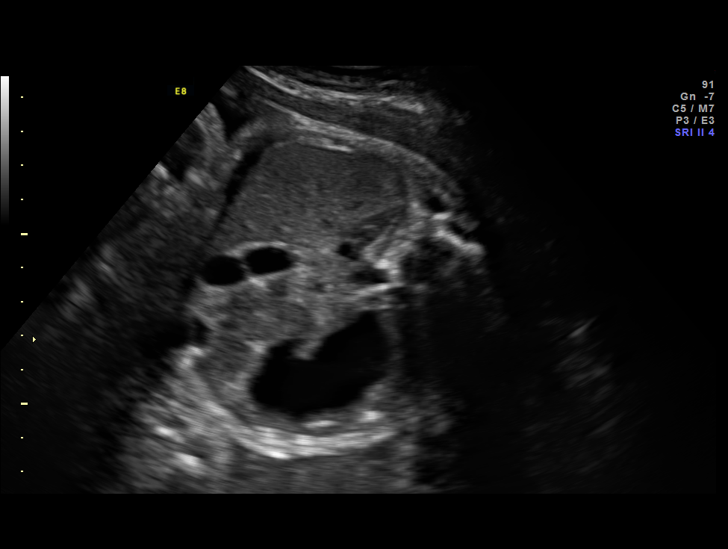
[im 17/31]
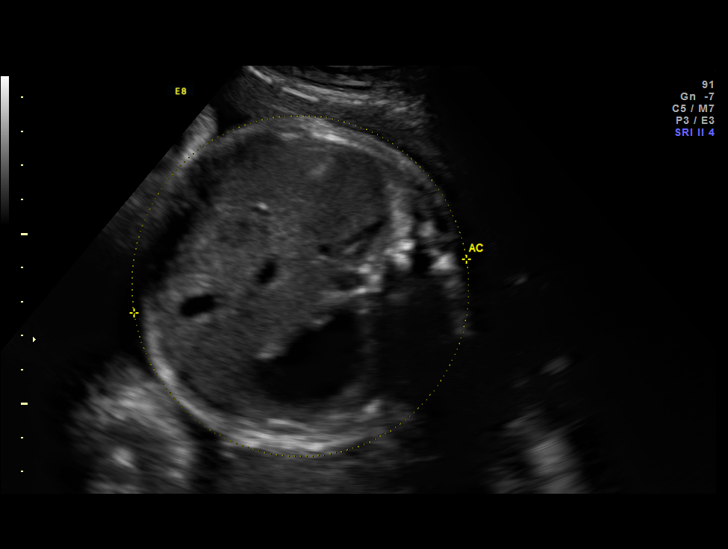
[im 19/31]
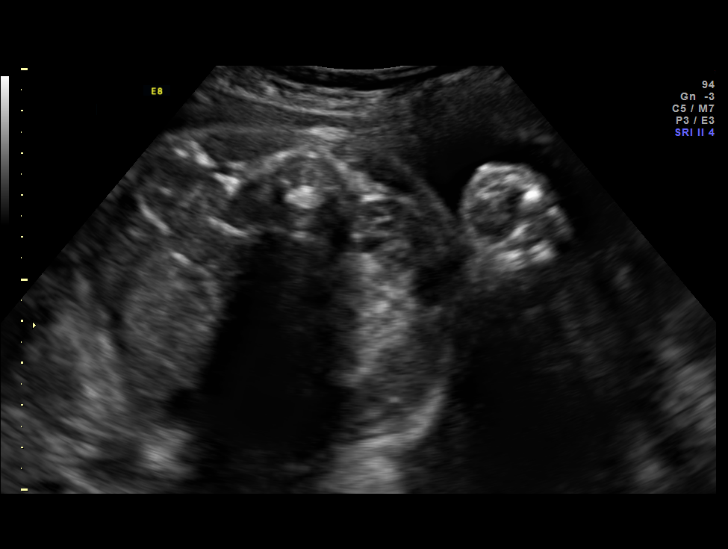
[im 22/31]
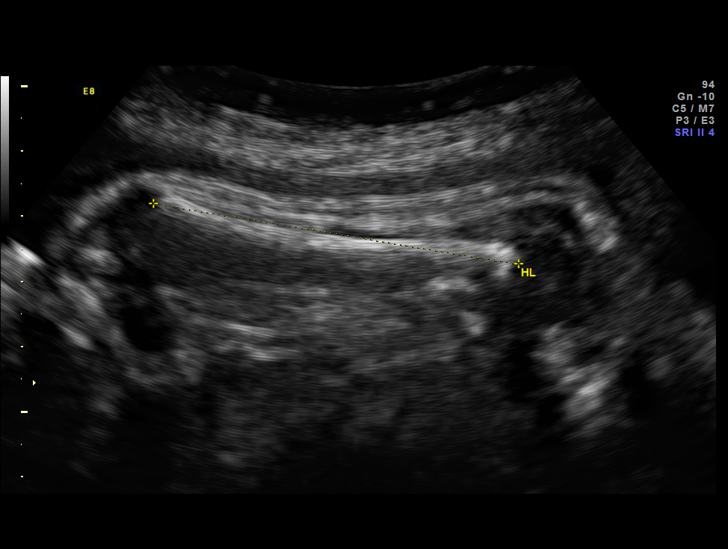
[im 25/31]
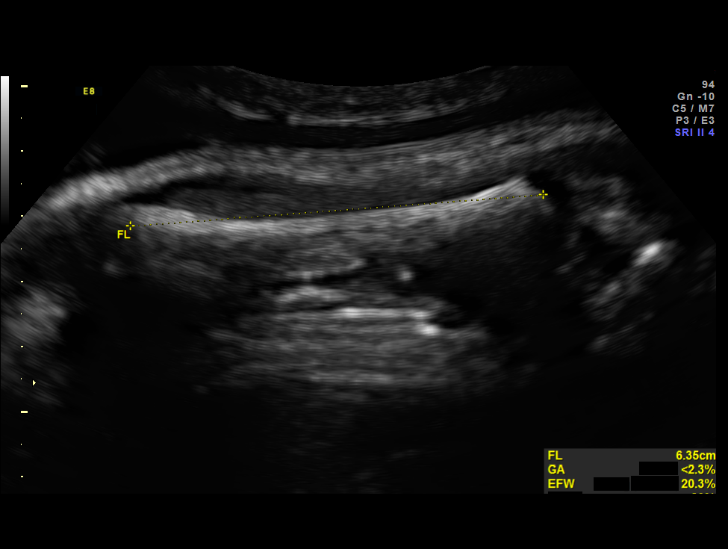
[im 27/31]
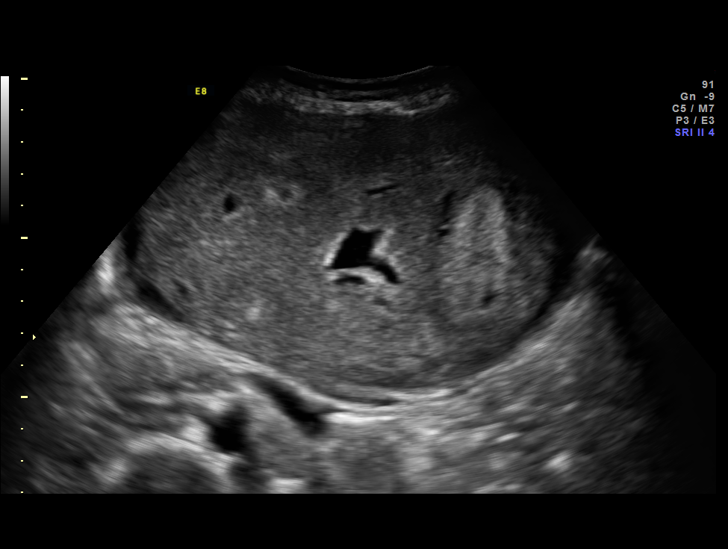
[im 29/31]
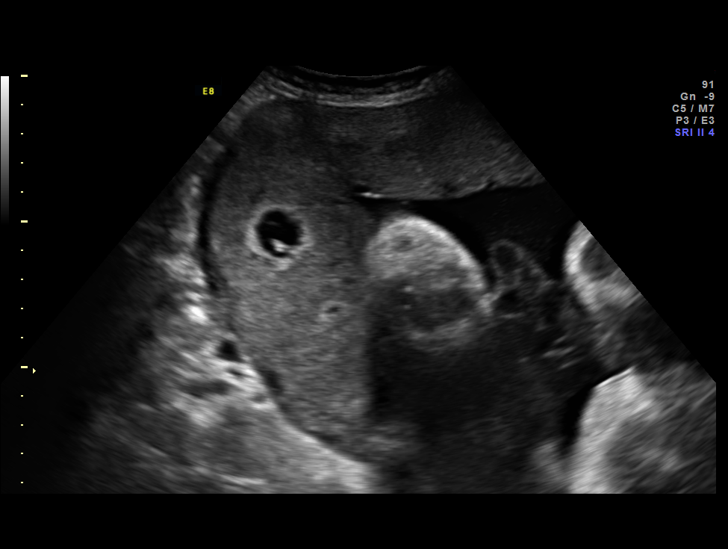

[12 of 28 positions shown; findings below may reference images not displayed]

OBSTETRICS REPORT
                      (Signed Final 02/05/2013 [DATE])

Service(s) Provided

 US OB FOLLOW UP                                       76816.1
Indications

 Size less than dates (Small for gestational [AGE]
 FGR)
 Poor obstetric history: Previous preterm delivery
 (35 weeks, 4lb 7oz)
 History of cesarean delivery, currently pregnant      654.20,
 Rh negative
Fetal Evaluation

 Num Of Fetuses:    1
 Fetal Heart Rate:  165                          bpm
 Cardiac Activity:  Observed
 Presentation:      Cephalic
 Placenta:          Fundal, above cervical os
 P. Cord            Previously Visualized
 Insertion:

 Amniotic Fluid
 AFI FV:      Subjectively within normal limits
 AFI Sum:     15.64   cm       59  %Tile     Larg Pckt:     7.8  cm
 RUQ:   2.34    cm   RLQ:    2.44   cm    LUQ:   7.8     cm   LLQ:    3.06   cm
Biometry

 BPD:     85.7  mm     G. Age:  34w 4d                CI:         76.4   70 - 86
 OFD:    112.2  mm                                    FL/HC:      20.0   20.8 -

 HC:     318.3  mm     G. Age:  35w 6d        7  %    HC/AC:      1.01   0.92 -

 AC:     316.2  mm     G. Age:  35w 4d       24  %    FL/BPD:     74.2   71 - 87
 FL:      63.6  mm     G. Age:  32w 6d      < 3  %    FL/AC:      20.1   20 - 24
 HUM:     56.2  mm     G. Age:  32w 5d      < 5  %

 Est. FW:    1263  gm      5 lb 9 oz     21  %
Gestational Age

 LMP:           37w 0d        Date:  05/22/12                 EDD:   02/26/13
 U/S Today:     34w 5d                                        EDD:   03/14/13
 Best:          37w 0d     Det. By:  LMP  (05/22/12)          EDD:   02/26/13
Anatomy

 Cranium:          Appears normal         Aortic Arch:      Previously seen
 Fetal Cavum:      Appears normal         Ductal Arch:      Previously seen
 Ventricles:       Appears normal         Diaphragm:        Previously seen
 Choroid Plexus:   Previously seen        Stomach:          Appears normal, left
                                                            sided
 Cerebellum:       Previously seen        Abdomen:          Appears normal
 Posterior Fossa:  Previously seen        Abdominal Wall:   Previously seen
 Nuchal Fold:      Not applicable (>20    Cord Vessels:     Previously seen
                   wks GA)
 Face:             Orbits and profile     Kidneys:          Appear normal
                   previously seen
 Lips:             Previously seen        Bladder:          Appears normal
 Heart:            Appears normal         Spine:            Previously seen
                   (4CH, axis, and
                   situs)
 RVOT:             Previously seen        Lower             Previously seen
                                          Extremities:
 LVOT:             Previously seen        Upper             Previously seen
                                          Extremities:

 Other:  Male gender previously seen. Heels and 5th digit previously seen.
Cervix Uterus Adnexa

 Cervix:       Not visualized (advanced GA >02wks)
Impression

 Single IUP at 37 0/7 weeks
 Overall fetal growth is appropriate (21st %tile).  The AC
 measures at the 24th %tile.
 The femurs and humeri are somewhat shortened, but normal
 in morphology - likely constitutionally small
 Normal interval anatomy
 Normal amniotic fluid volume
Recommendations

 Follow-up ultrasounds as clinically indicated.

 questions or concerns.

## 2016-10-04 LAB — OB RESULTS CONSOLE ABO/RH: RH TYPE: NEGATIVE

## 2016-10-04 LAB — OB RESULTS CONSOLE RUBELLA ANTIBODY, IGM: RUBELLA: IMMUNE

## 2016-10-04 LAB — OB RESULTS CONSOLE GC/CHLAMYDIA
CHLAMYDIA, DNA PROBE: NEGATIVE
GC PROBE AMP, GENITAL: NEGATIVE

## 2016-10-04 LAB — OB RESULTS CONSOLE ANTIBODY SCREEN: Antibody Screen: NEGATIVE

## 2016-10-04 LAB — OB RESULTS CONSOLE HIV ANTIBODY (ROUTINE TESTING): HIV: NONREACTIVE

## 2016-10-04 LAB — OB RESULTS CONSOLE HEPATITIS B SURFACE ANTIGEN: HEP B S AG: NEGATIVE

## 2016-10-04 LAB — OB RESULTS CONSOLE RPR: RPR: NONREACTIVE

## 2017-02-06 NOTE — L&D Delivery Note (Signed)
Patient was C/C/+2 and pushed for 5 minutes with epidural.    NSVD  female infant, Apgars 8.9, weight P.   The patient had Graves midline first degree perineal laceration repaired with 3-0Vicryl R. Fundus was firm. EBL was expected amount. Placenta was delivered intact. Vagina was clear.  Delayed cord clamping done for 30-60 seconds while warming baby. Baby was vigorous and doing skin to skin with mother.  Stacey Graves

## 2017-03-29 LAB — OB RESULTS CONSOLE GBS: GBS: POSITIVE

## 2017-04-09 ENCOUNTER — Other Ambulatory Visit: Payer: Self-pay | Admitting: Obstetrics and Gynecology

## 2017-04-11 ENCOUNTER — Encounter (HOSPITAL_COMMUNITY): Payer: Self-pay | Admitting: *Deleted

## 2017-04-11 ENCOUNTER — Telehealth (HOSPITAL_COMMUNITY): Payer: Self-pay | Admitting: *Deleted

## 2017-04-11 NOTE — Telephone Encounter (Signed)
Preadmission screen  

## 2017-04-19 ENCOUNTER — Encounter (HOSPITAL_COMMUNITY): Payer: Self-pay

## 2017-04-19 ENCOUNTER — Inpatient Hospital Stay (HOSPITAL_COMMUNITY)
Admission: RE | Admit: 2017-04-19 | Discharge: 2017-04-21 | DRG: 807 | Disposition: A | Discharge status: Home / Self Care | Payer: Medicaid Other | Source: Ambulatory Visit | Attending: Obstetrics and Gynecology | Admitting: Obstetrics and Gynecology

## 2017-04-19 VITALS — BP 106/64 | HR 60 | Temp 97.9°F | Resp 14 | Ht 63.0 in | Wt 139.9 lb

## 2017-04-19 DIAGNOSIS — O365930 Maternal care for other known or suspected poor fetal growth, third trimester, not applicable or unspecified: Principal | ICD-10-CM | POA: Diagnosis present

## 2017-04-19 DIAGNOSIS — Z6791 Unspecified blood type, Rh negative: Secondary | ICD-10-CM

## 2017-04-19 DIAGNOSIS — O99824 Streptococcus B carrier state complicating childbirth: Secondary | ICD-10-CM | POA: Diagnosis present

## 2017-04-19 DIAGNOSIS — O26893 Other specified pregnancy related conditions, third trimester: Secondary | ICD-10-CM | POA: Diagnosis present

## 2017-04-19 DIAGNOSIS — Z3A38 38 weeks gestation of pregnancy: Secondary | ICD-10-CM

## 2017-04-19 DIAGNOSIS — O365992 Maternal care for other known or suspected poor fetal growth, unspecified trimester, fetus 2: Secondary | ICD-10-CM | POA: Diagnosis present

## 2017-04-19 DIAGNOSIS — O34219 Maternal care for unspecified type scar from previous cesarean delivery: Secondary | ICD-10-CM | POA: Diagnosis present

## 2017-04-19 LAB — CBC
HEMATOCRIT: 37.5 % (ref 36.0–46.0)
HEMOGLOBIN: 12.9 g/dL (ref 12.0–15.0)
MCH: 31.2 pg (ref 26.0–34.0)
MCHC: 34.4 g/dL (ref 30.0–36.0)
MCV: 90.8 fL (ref 78.0–100.0)
Platelets: 289 10*3/uL (ref 150–400)
RBC: 4.13 MIL/uL (ref 3.87–5.11)
RDW: 13.6 % (ref 11.5–15.5)
WBC: 5.2 10*3/uL (ref 4.0–10.5)

## 2017-04-19 LAB — RPR: RPR: NONREACTIVE

## 2017-04-19 LAB — TYPE AND SCREEN
ABO/RH(D): A NEG
ANTIBODY SCREEN: NEGATIVE

## 2017-04-19 MED ORDER — MAGNESIUM HYDROXIDE 400 MG/5ML PO SUSP: 30.0000 mL | ORAL | Status: DC | PRN

## 2017-04-19 MED ORDER — ZOLPIDEM TARTRATE 5 MG PO TABS: 5.0000 mg | ORAL_TABLET | Freq: Every evening | ORAL | Status: DC | PRN

## 2017-04-19 MED ORDER — LACTATED RINGERS IV SOLN: 500.0000 mL | INTRAVENOUS | Status: DC | PRN

## 2017-04-19 MED ORDER — SOD CITRATE-CITRIC ACID 500-334 MG/5ML PO SOLN: 30.0000 mL | ORAL | Status: DC | PRN

## 2017-04-19 MED ORDER — LACTATED RINGERS IV SOLN: 500.0000 mL | Freq: Once | INTRAVENOUS | Status: DC

## 2017-04-19 MED ORDER — ONDANSETRON HCL 4 MG PO TABS: 4.0000 mg | ORAL_TABLET | ORAL | Status: DC | PRN

## 2017-04-19 MED ORDER — OXYTOCIN 40 UNITS IN LACTATED RINGERS INFUSION - SIMPLE MED: 1.0000 m[IU]/min | INTRAVENOUS | Status: DC

## 2017-04-19 MED ORDER — FENTANYL 2.5 MCG/ML BUPIVACAINE 1/10 % EPIDURAL INFUSION (WH - ANES): 14.0000 mL/h | INTRAMUSCULAR | Status: DC | PRN

## 2017-04-19 MED ORDER — FERROUS SULFATE 325 (65 FE) MG PO TABS
325.0000 mg | ORAL_TABLET | Freq: Two times a day (BID) | ORAL | Status: DC
  Administered 2017-04-20 – 2017-04-21 (×3): 325 mg via ORAL
  Filled 2017-04-19 (×3): qty 1

## 2017-04-19 MED ORDER — TETANUS-DIPHTH-ACELL PERTUSSIS 5-2.5-18.5 LF-MCG/0.5 IM SUSP: 0.5000 mL | Freq: Once | INTRAMUSCULAR | Status: DC

## 2017-04-19 MED ORDER — ONDANSETRON HCL 4 MG/2ML IJ SOLN: 4.0000 mg | Freq: Four times a day (QID) | INTRAMUSCULAR | Status: DC | PRN

## 2017-04-19 MED ORDER — PENICILLIN G POT IN DEXTROSE 60000 UNIT/ML IV SOLN
3.0000 10*6.[IU] | INTRAVENOUS | Status: DC
  Administered 2017-04-19 (×2): 3 10*6.[IU] via INTRAVENOUS
  Filled 2017-04-19 (×4): qty 50

## 2017-04-19 MED ORDER — NALOXONE HCL 0.4 MG/ML IJ SOLN
INTRAMUSCULAR | Status: AC
  Filled 2017-04-19: qty 1

## 2017-04-19 MED ORDER — SODIUM CHLORIDE 0.9% FLUSH: 3.0000 mL | INTRAVENOUS | Status: DC | PRN

## 2017-04-19 MED ORDER — DIPHENHYDRAMINE HCL 50 MG/ML IJ SOLN: 12.5000 mg | INTRAMUSCULAR | Status: DC | PRN

## 2017-04-19 MED ORDER — WITCH HAZEL-GLYCERIN EX PADS: 1.0000 "application " | MEDICATED_PAD | CUTANEOUS | Status: DC | PRN

## 2017-04-19 MED ORDER — LACTATED RINGERS IV SOLN
INTRAVENOUS | Status: DC
  Administered 2017-04-19: 09:00:00 via INTRAVENOUS

## 2017-04-19 MED ORDER — EPHEDRINE 5 MG/ML INJ
10.0000 mg | INTRAVENOUS | Status: DC | PRN
  Filled 2017-04-19: qty 2

## 2017-04-19 MED ORDER — BENZOCAINE-MENTHOL 20-0.5 % EX AERO: 1.0000 "application " | INHALATION_SPRAY | CUTANEOUS | Status: DC | PRN

## 2017-04-19 MED ORDER — SODIUM CHLORIDE 0.9 % IV SOLN
5.0000 10*6.[IU] | Freq: Once | INTRAVENOUS | Status: AC
  Administered 2017-04-19: 5 10*6.[IU] via INTRAVENOUS
  Filled 2017-04-19: qty 5

## 2017-04-19 MED ORDER — PHENYLEPHRINE 40 MCG/ML (10ML) SYRINGE FOR IV PUSH (FOR BLOOD PRESSURE SUPPORT)
80.0000 ug | PREFILLED_SYRINGE | INTRAVENOUS | Status: DC | PRN
  Filled 2017-04-19: qty 5

## 2017-04-19 MED ORDER — OXYTOCIN 40 UNITS IN LACTATED RINGERS INFUSION - SIMPLE MED: 2.5000 [IU]/h | INTRAVENOUS | Status: DC

## 2017-04-19 MED ORDER — MEASLES, MUMPS & RUBELLA VAC ~~LOC~~ INJ: 0.5000 mL | INJECTION | Freq: Once | SUBCUTANEOUS | Status: DC

## 2017-04-19 MED ORDER — OXYCODONE-ACETAMINOPHEN 5-325 MG PO TABS: 2.0000 | ORAL_TABLET | ORAL | Status: DC | PRN

## 2017-04-19 MED ORDER — LIDOCAINE HCL (PF) 1 % IJ SOLN
30.0000 mL | INTRAMUSCULAR | Status: DC | PRN
  Administered 2017-04-19: 30 mL via SUBCUTANEOUS
  Filled 2017-04-19: qty 30

## 2017-04-19 MED ORDER — PHENYLEPHRINE 40 MCG/ML (10ML) SYRINGE FOR IV PUSH (FOR BLOOD PRESSURE SUPPORT)
80.0000 ug | PREFILLED_SYRINGE | INTRAVENOUS | Status: DC | PRN
Start: 2017-04-19 — End: 2017-04-19
  Filled 2017-04-19: qty 5

## 2017-04-19 MED ORDER — OXYTOCIN BOLUS FROM INFUSION
500.0000 mL | Freq: Once | INTRAVENOUS | Status: AC
  Administered 2017-04-19: 500 mL via INTRAVENOUS

## 2017-04-19 MED ORDER — IBUPROFEN 800 MG PO TABS
800.0000 mg | ORAL_TABLET | Freq: Three times a day (TID) | ORAL | Status: DC
  Administered 2017-04-19 – 2017-04-21 (×6): 800 mg via ORAL
  Filled 2017-04-19 (×6): qty 1

## 2017-04-19 MED ORDER — PRENATAL MULTIVITAMIN CH
1.0000 | ORAL_TABLET | Freq: Every day | ORAL | Status: DC
  Administered 2017-04-20 – 2017-04-21 (×2): 1 via ORAL
  Filled 2017-04-19 (×2): qty 1

## 2017-04-19 MED ORDER — METHYLERGONOVINE MALEATE 0.2 MG PO TABS
0.2000 mg | ORAL_TABLET | ORAL | Status: DC | PRN
Start: 2017-04-19 — End: 2017-04-21

## 2017-04-19 MED ORDER — SIMETHICONE 80 MG PO CHEW: 80.0000 mg | CHEWABLE_TABLET | ORAL | Status: DC | PRN

## 2017-04-19 MED ORDER — EPHEDRINE 5 MG/ML INJ
10.0000 mg | INTRAVENOUS | Status: DC | PRN
Start: 2017-04-19 — End: 2017-04-19
  Filled 2017-04-19: qty 2

## 2017-04-19 MED ORDER — TERBUTALINE SULFATE 1 MG/ML IJ SOLN
0.2500 mg | Freq: Once | INTRAMUSCULAR | Status: DC | PRN
  Filled 2017-04-19: qty 1

## 2017-04-19 MED ORDER — ONDANSETRON HCL 4 MG/2ML IJ SOLN: 4.0000 mg | INTRAMUSCULAR | Status: DC | PRN

## 2017-04-19 MED ORDER — SODIUM CHLORIDE 0.9 % IV SOLN: 250.0000 mL | INTRAVENOUS | Status: DC | PRN

## 2017-04-19 MED ORDER — COCONUT OIL OIL: 1.0000 "application " | TOPICAL_OIL | Status: DC | PRN

## 2017-04-19 MED ORDER — ACETAMINOPHEN 325 MG PO TABS: 650.0000 mg | ORAL_TABLET | ORAL | Status: DC | PRN

## 2017-04-19 MED ORDER — DIPHENHYDRAMINE HCL 25 MG PO CAPS: 25.0000 mg | ORAL_CAPSULE | Freq: Four times a day (QID) | ORAL | Status: DC | PRN

## 2017-04-19 MED ORDER — SODIUM CHLORIDE 0.9% FLUSH: 3.0000 mL | Freq: Two times a day (BID) | INTRAVENOUS | Status: DC

## 2017-04-19 MED ORDER — OXYTOCIN 40 UNITS IN LACTATED RINGERS INFUSION - SIMPLE MED
1.0000 m[IU]/min | INTRAVENOUS | Status: DC
  Administered 2017-04-19: 2 m[IU]/min via INTRAVENOUS
  Filled 2017-04-19: qty 1000

## 2017-04-19 MED ORDER — METHYLERGONOVINE MALEATE 0.2 MG/ML IJ SOLN: 0.2000 mg | INTRAMUSCULAR | Status: DC | PRN

## 2017-04-19 MED ORDER — DIBUCAINE 1 % RE OINT: 1.0000 "application " | TOPICAL_OINTMENT | RECTAL | Status: DC | PRN

## 2017-04-19 MED ORDER — BUTORPHANOL TARTRATE 1 MG/ML IJ SOLN
1.0000 mg | Freq: Once | INTRAMUSCULAR | Status: AC
  Administered 2017-04-19: 1 mg via INTRAVENOUS
  Filled 2017-04-19: qty 1

## 2017-04-19 MED ORDER — SENNOSIDES-DOCUSATE SODIUM 8.6-50 MG PO TABS
2.0000 | ORAL_TABLET | ORAL | Status: DC
  Administered 2017-04-21: 2 via ORAL
  Filled 2017-04-19: qty 2

## 2017-04-19 NOTE — H&P (Signed)
37 y.o. 81108w3d  [id number] comes in for term induction for SGA/IUGR.  Otherwise has good fetal movement and no bleeding.  Pt has history of C/S but two successful VBACS since and desires TOL.  Past Medical History:  Diagnosis Date  . Asthma   . Medical history non-contributory   . No pertinent past medical history   . Preterm delivery     Past Surgical History:  Procedure Laterality Date  . CESAREAN SECTION  02/08/2011   Procedure: CESAREAN SECTION;  Surgeon: Caprice Beaverobert M Wein;  Location: [facility];  Service: Gynecology;  Laterality: N/A;    OB History  Gravida Para Term Preterm AB Living  4 3 2 1   3   SAB TAB Ectopic Multiple Live Births        0 3    # Outcome Date GA Lbr Len/2nd Weight Sex Delivery Anes PTL Lv  4 Current           3 Term 12/30/14 3746w1d 04:54 / 00:08 6 lb 13.2 oz (3.096 kg) M Vag-Spont None  LIV  2 Term 02/20/13 5962w1d 07:21 / 03:20 6 lb 4.5 oz (2.849 kg) F VBAC EPI  LIV  1 Preterm 02/08/11 6134w5d  4 lb 3.8 oz (1.923 kg) F CS-LTranv Gen  LIV      Social History   Socioeconomic History  . Marital status: Married    Spouse name: Not on file  . Number of children: Not on file  . Years of education: Not on file  . Highest education level: Not on file  Social Needs  . Financial resource strain: Not on file  . Food insecurity - worry: Not on file  . Food insecurity - inability: Not on file  . Transportation needs - medical: Not on file  . Transportation needs - non-medical: Not on file  Occupational History  . Not on file  Tobacco Use  . Smoking status: Never Smoker  . Smokeless tobacco: Never Used  Substance and Sexual Activity  . Alcohol use: No  . Drug use: No  . Sexual activity: Yes    Birth control/protection: None  Other Topics Concern  . Not on file  Social History Narrative  . Not on file   Patient has no known allergies.    Prenatal Transfer Tool  Maternal Diabetes: No Genetic Screening: Normal- AMA, NIPT low risk female Maternal  Ultrasounds/Referrals: Abnormal:  Findings:   IUGR- symmetric but <10%; dopplers and BPPs normal; MFM rec delivery by 39 weeks.  Last EFW on 3-12 was 5#6. Fetal Ultrasounds or other Referrals:  None Maternal Substance Abuse:  No Significant Maternal Medications:  None Significant Maternal Lab Results: Lab values include: Group B Strep positive, Rh negative  Other PNC: Otherwise uncomplicated.  Passed 3 hour gtt.     There were no vitals filed for this visit.  Lungs/Cor:  NAD Abdomen:  soft, gravid Ex:  no cords, erythema SVE:  2/60/-2 per nurse FHTs:  130, good STV, NST R Toco:  q occ   A/P   Term borderline IUGR with normal ANT.  For induction at term.   GBS pos- PCN.   Stacey Graves A

## 2017-04-19 NOTE — Anesthesia Pain Management Evaluation Note (Signed)
  CRNA Pain Management Visit Note  Patient: Stacey Graves, 37 y.o., female  "Hello I am a member of the anesthesia team at [facility]. We have an anesthesia team available at all times to provide care throughout the hospital, including epidural management and anesthesia for C-section. I don't know your plan for the delivery whether it a natural birth, water birth, IV sedation, nitrous supplementation, doula or epidural, but we want to meet your pain goals."   1.Was your pain managed to your expectations on prior hospitalizations?   Yes   2.What is your expectation for pain management during this hospitalization?     Epidural  3.How can we help you reach that goal? Epidural when appropriate  Record the patient's initial score and the patient's pain goal.   Pain: 4  Pain Goal: 7 [facility] wants you to be able to say your pain was always managed very well.  [name], [name] R 04/19/2017

## 2017-04-19 NOTE — Consult Note (Signed)
Delivery team called to attend the delivery.  Team arrived and infant just born crying vigorously so NICU delivery team was excused by Dr. Henderson CloudHorvath.   Overton MamMary Ann T Deandre Brannan, MD (Attending Neonatologist)

## 2017-04-20 LAB — CBC
HCT: 36 % (ref 36.0–46.0)
HEMOGLOBIN: 12.3 g/dL (ref 12.0–15.0)
MCH: 31.3 pg (ref 26.0–34.0)
MCHC: 34.2 g/dL (ref 30.0–36.0)
MCV: 91.6 fL (ref 78.0–100.0)
PLATELETS: 252 10*3/uL (ref 150–400)
RBC: 3.93 MIL/uL (ref 3.87–5.11)
RDW: 13.7 % (ref 11.5–15.5)
WBC: 10.1 10*3/uL (ref 4.0–10.5)

## 2017-04-20 NOTE — Lactation Note (Signed)
This note was copied from a baby's chart. Lactation Consultation Note  Patient Name: Stacey Graves Today's Date: 04/20/2017 Reason for consult: Initial assessment;Early term 9737-38.6wks  6224 hours old early term female who is being exclusively BF by her mother, she's a P4. [facility] and chose the Fully BF package, she feels pretty confident about BF, she was able to BF her other children for 18 months.  Baby was already nursing when entering the room, had to reposition infant due to a shallow latch, explained to mom about head and neck support in cradle hold Vs. Cross cradle; baby was able to get a deeper latch in cross cradle hold. Encouraged mom doing STS during feedings, baby was swaddled in a blanket when at the breast. Mom will continue feeding baby 8-12 times/24 hours on cues and will try STS during feeding.  BF brochure, BF resources and feeding diary were reviewed, mom is aware of LC services and will call PRN.  Maternal Data Formula Feeding for Exclusion: No Has patient been taught Hand Expression?: Yes Does the patient have breastfeeding experience prior to this delivery?: Yes  Feeding Feeding Type: Breast Fed Length of feed: 30 min  LATCH Score Latch: Grasps breast easily, tongue down, lips flanged, rhythmical sucking.(baby was already nursing when entering the room)  Audible Swallowing: A few with stimulation  Type of Nipple: Everted at rest and after stimulation  Comfort (Breast/Nipple): Soft / non-tender  Hold (Positioning): Assistance needed to correctly position infant at breast and maintain latch.  LATCH Score: 8  Interventions Interventions: Breast feeding basics reviewed;Assisted with latch;Breast massage;Breast compression;Adjust position;Support pillows  Lactation Tools Discussed/Used WIC Program: Yes   Consult Status Consult Status: Follow-up Date: 04/21/17 Follow-up type: In-patient    Shade Kaley Venetia ConstableS Jamesrobert Ohanesian 04/20/2017,  8:11 PM

## 2017-04-20 NOTE — Progress Notes (Signed)
Patient is eating, ambulating, voiding.  Pain control is good.  Appropriate lochia, no complaints.  Vitals:   04/19/17 2116 04/19/17 2250 04/20/17 0240 04/20/17 0950  BP: 110/66 (!) 114/52 (!) 104/59 127/76  Pulse: 64 66 67 69  Resp: 20 18 18 18   Temp: 98.1 F (36.7 C) 98 F (36.7 C) 98.9 F (37.2 C) 98.3 F (36.8 C)  TempSrc: Oral Oral Oral Oral  SpO2: 98%   99%  Weight:      Height:        Fundus firm No calf tenderness  Lab Results  Component Value Date   WBC 10.1 04/20/2017   HGB 12.3 04/20/2017   HCT 36.0 04/20/2017   MCV 91.6 04/20/2017   PLT 252 04/20/2017    --/--/A NEG (03/14 0905)  A/P Post partum day 1 Doing well  Routine care.    Philip AspenALLAHAN, Jimie Kuwahara

## 2017-04-21 ENCOUNTER — Other Ambulatory Visit: Payer: Self-pay

## 2017-04-21 NOTE — Discharge Summary (Signed)
Obstetric Discharge Summary Reason for Admission: induction of labor and IUGR Prenatal Procedures: NST and ultrasound Intrapartum Procedures: spontaneous vaginal delivery Postpartum Procedures: none Complications-Operative and Postpartum: 1st degree perineal laceration Hemoglobin  Date Value Ref Range Status  04/20/2017 12.3 12.0 - 15.0 g/dL Final   HCT  Date Value Ref Range Status  04/20/2017 36.0 36.0 - 46.0 % Final    Physical Exam:  General: alert and cooperative Lochia: appropriate Uterine Fundus: firm DVT Evaluation: No evidence of DVT seen on physical exam.  Discharge Diagnoses: Term Pregnancy-delivered  Discharge Information: Date: 04/21/2017 Activity: pelvic rest Diet: routine Medications: PNV and Ibuprofen Condition: stable Instructions: refer to practice specific booklet Discharge to: home Follow-up Information    Carrington ClampHorvath, Michelle, MD Follow up in 4 week(s).   Specialty:  Obstetrics and Gynecology Contact information: [address] [place] [id number] [phone]           Newborn Data: Live born female  Birth Weight: 6 lb 4.4 oz (2845 g) APGAR: 8, 9  Newborn Delivery   Birth date/time:  04/19/2017 19:55:00 Delivery type:  VBAC, Spontaneous     Home with mother.  Stacey Graves, Stacey Graves 04/21/2017, 10:13 AM

## 2018-02-06 NOTE — L&D Delivery Note (Signed)
Delivery Note At 9:05 AM a viable and healthy female was delivered via Vaginal, Spontaneous (Presentation: Left  Occiput Anterior).  APGAR: 8, 9; weight pending .   Placenta status: Spontaneous, Intact.  Cord: 3 vessels  Anesthesia: 1% lidocaine Episiotomy: None Lacerations: 1st degree;Perineal Suture Repair: 3.0 vicryl Est. Blood Loss (mL): 20  Mom to postpartum.  Baby to Couplet care / Skin to Skin.  [name] 01/29/2019, 9:50 AM

## 2018-07-03 LAB — OB RESULTS CONSOLE GC/CHLAMYDIA
Chlamydia: NEGATIVE
Gonorrhea: NEGATIVE

## 2018-07-03 LAB — OB RESULTS CONSOLE HEPATITIS B SURFACE ANTIGEN: Hepatitis B Surface Ag: NEGATIVE

## 2018-07-03 LAB — OB RESULTS CONSOLE HIV ANTIBODY (ROUTINE TESTING): HIV: NONREACTIVE

## 2018-07-03 LAB — OB RESULTS CONSOLE RUBELLA ANTIBODY, IGM: Rubella: IMMUNE

## 2018-08-21 ENCOUNTER — Encounter: Payer: Self-pay | Admitting: Registered"

## 2018-08-21 ENCOUNTER — Other Ambulatory Visit: Payer: Self-pay

## 2018-08-21 ENCOUNTER — Encounter: Payer: Medicaid Other | Attending: Obstetrics and Gynecology | Admitting: Registered"

## 2018-08-21 DIAGNOSIS — O99810 Abnormal glucose complicating pregnancy: Secondary | ICD-10-CM | POA: Diagnosis present

## 2018-08-21 NOTE — Progress Notes (Signed)
Patient was seen on 08/21/2018 for Gestational Diabetes self-management class at [facility]. The following learning objectives were met by the patient during this course:   States the definition of Gestational Diabetes  States why dietary management is important in controlling blood glucose  Describes the effects each nutrient has on blood glucose levels  Demonstrates ability to create a balanced meal plan  Demonstrates carbohydrate counting   States when to check blood glucose levels  Demonstrates proper blood glucose monitoring techniques  States the effect of stress and exercise on blood glucose levels  States the importance of limiting caffeine and abstaining from alcohol and smoking  Blood glucose monitor given: Accu-chek Guide Me Lot # [id number] Exp: 10/31/2019 Blood glucose reading: 82 mg/dL  Patient instructed to monitor glucose levels: FBS: 60 - <95; 1 hour: <140; 2 hour: <120  Patient received handouts:  Nutrition Diabetes and Pregnancy, including carb counting list  Patient will be seen for follow-up as needed.

## 2019-01-06 LAB — OB RESULTS CONSOLE GBS: GBS: NEGATIVE

## 2019-01-22 ENCOUNTER — Telehealth (HOSPITAL_COMMUNITY): Payer: Self-pay | Admitting: *Deleted

## 2019-01-22 ENCOUNTER — Other Ambulatory Visit: Payer: Self-pay | Admitting: Obstetrics and Gynecology

## 2019-01-22 ENCOUNTER — Encounter (HOSPITAL_COMMUNITY): Payer: Self-pay | Admitting: *Deleted

## 2019-01-22 NOTE — Telephone Encounter (Signed)
Preadmission screen  

## 2019-01-23 ENCOUNTER — Other Ambulatory Visit: Payer: Self-pay | Admitting: Obstetrics and Gynecology

## 2019-01-27 ENCOUNTER — Other Ambulatory Visit (HOSPITAL_COMMUNITY)
Admission: RE | Admit: 2019-01-27 | Discharge: 2019-01-27 | Disposition: A | Discharge status: Home / Self Care | Payer: Medicaid Other | Source: Ambulatory Visit | Attending: Obstetrics and Gynecology | Admitting: Obstetrics and Gynecology

## 2019-01-27 DIAGNOSIS — Z20828 Contact with and (suspected) exposure to other viral communicable diseases: Secondary | ICD-10-CM | POA: Insufficient documentation

## 2019-01-27 DIAGNOSIS — Z01812 Encounter for preprocedural laboratory examination: Secondary | ICD-10-CM | POA: Insufficient documentation

## 2019-01-27 LAB — SARS CORONAVIRUS 2 (TAT 6-24 HRS): SARS Coronavirus 2: NEGATIVE

## 2019-01-29 ENCOUNTER — Encounter (HOSPITAL_COMMUNITY): Payer: Self-pay | Admitting: Obstetrics and Gynecology

## 2019-01-29 ENCOUNTER — Inpatient Hospital Stay (HOSPITAL_COMMUNITY): Payer: Medicaid Other

## 2019-01-29 ENCOUNTER — Other Ambulatory Visit: Payer: Self-pay

## 2019-01-29 ENCOUNTER — Inpatient Hospital Stay (HOSPITAL_COMMUNITY)
Admission: AD | Admit: 2019-01-29 | Discharge: 2019-01-30 | DRG: 807 | Disposition: A | Discharge status: Home / Self Care | Payer: Medicaid Other | Attending: Obstetrics and Gynecology | Admitting: Obstetrics and Gynecology

## 2019-01-29 ENCOUNTER — Inpatient Hospital Stay (HOSPITAL_COMMUNITY)
Admission: AD | Admit: 2019-01-29 | Payer: Medicaid Other | Source: Home / Self Care | Admitting: Obstetrics and Gynecology

## 2019-01-29 DIAGNOSIS — Z3A39 39 weeks gestation of pregnancy: Secondary | ICD-10-CM

## 2019-01-29 DIAGNOSIS — O09529 Supervision of elderly multigravida, unspecified trimester: Secondary | ICD-10-CM

## 2019-01-29 DIAGNOSIS — Z6791 Unspecified blood type, Rh negative: Secondary | ICD-10-CM | POA: Diagnosis not present

## 2019-01-29 DIAGNOSIS — O26893 Other specified pregnancy related conditions, third trimester: Secondary | ICD-10-CM | POA: Diagnosis present

## 2019-01-29 DIAGNOSIS — O34219 Maternal care for unspecified type scar from previous cesarean delivery: Principal | ICD-10-CM | POA: Diagnosis present

## 2019-01-29 LAB — TYPE AND SCREEN
ABO/RH(D): A NEG
Antibody Screen: NEGATIVE

## 2019-01-29 LAB — CBC
HCT: 41.6 % (ref 36.0–46.0)
Hemoglobin: 13.8 g/dL (ref 12.0–15.0)
MCH: 31.1 pg (ref 26.0–34.0)
MCHC: 33.2 g/dL (ref 30.0–36.0)
MCV: 93.7 fL (ref 80.0–100.0)
Platelets: 266 10*3/uL (ref 150–400)
RBC: 4.44 MIL/uL (ref 3.87–5.11)
RDW: 13.5 % (ref 11.5–15.5)
WBC: 6 10*3/uL (ref 4.0–10.5)
nRBC: 0 % (ref 0.0–0.2)

## 2019-01-29 LAB — RPR: RPR Ser Ql: NONREACTIVE

## 2019-01-29 MED ORDER — ZOLPIDEM TARTRATE 5 MG PO TABS: 5.0000 mg | ORAL_TABLET | Freq: Every evening | ORAL | Status: DC | PRN

## 2019-01-29 MED ORDER — OXYTOCIN 40 UNITS IN NORMAL SALINE INFUSION - SIMPLE MED
1.0000 m[IU]/min | INTRAVENOUS | Status: DC
  Administered 2019-01-29: 1 m[IU]/min via INTRAVENOUS
  Filled 2019-01-29: qty 1000

## 2019-01-29 MED ORDER — LACTATED RINGERS IV SOLN: INTRAVENOUS | Status: DC

## 2019-01-29 MED ORDER — PRENATAL MULTIVITAMIN CH
1.0000 | ORAL_TABLET | Freq: Every day | ORAL | Status: DC
  Administered 2019-01-29 – 2019-01-30 (×2): 1 via ORAL
  Filled 2019-01-29 (×2): qty 1

## 2019-01-29 MED ORDER — OXYTOCIN 40 UNITS IN NORMAL SALINE INFUSION - SIMPLE MED
2.5000 [IU]/h | INTRAVENOUS | Status: DC
  Administered 2019-01-29: 2.5 [IU]/h via INTRAVENOUS

## 2019-01-29 MED ORDER — ACETAMINOPHEN 325 MG PO TABS: 650.0000 mg | ORAL_TABLET | ORAL | Status: DC | PRN

## 2019-01-29 MED ORDER — TERBUTALINE SULFATE 1 MG/ML IJ SOLN: 0.2500 mg | Freq: Once | INTRAMUSCULAR | Status: DC | PRN

## 2019-01-29 MED ORDER — SENNOSIDES-DOCUSATE SODIUM 8.6-50 MG PO TABS
2.0000 | ORAL_TABLET | ORAL | Status: DC
  Administered 2019-01-30: 2 via ORAL
  Filled 2019-01-29: qty 2

## 2019-01-29 MED ORDER — COCONUT OIL OIL: 1.0000 "application " | TOPICAL_OIL | Status: DC | PRN

## 2019-01-29 MED ORDER — ONDANSETRON HCL 4 MG/2ML IJ SOLN: 4.0000 mg | Freq: Four times a day (QID) | INTRAMUSCULAR | Status: DC | PRN

## 2019-01-29 MED ORDER — METHYLERGONOVINE MALEATE 0.2 MG/ML IJ SOLN: 0.2000 mg | INTRAMUSCULAR | Status: DC | PRN

## 2019-01-29 MED ORDER — SOD CITRATE-CITRIC ACID 500-334 MG/5ML PO SOLN: 30.0000 mL | ORAL | Status: DC | PRN

## 2019-01-29 MED ORDER — METHYLERGONOVINE MALEATE 0.2 MG PO TABS: 0.2000 mg | ORAL_TABLET | ORAL | Status: DC | PRN

## 2019-01-29 MED ORDER — FENTANYL CITRATE (PF) 100 MCG/2ML IJ SOLN
INTRAMUSCULAR | Status: AC
  Filled 2019-01-29: qty 2

## 2019-01-29 MED ORDER — DIPHENHYDRAMINE HCL 25 MG PO CAPS: 25.0000 mg | ORAL_CAPSULE | Freq: Four times a day (QID) | ORAL | Status: DC | PRN

## 2019-01-29 MED ORDER — OXYCODONE-ACETAMINOPHEN 5-325 MG PO TABS: 1.0000 | ORAL_TABLET | ORAL | Status: DC | PRN

## 2019-01-29 MED ORDER — BENZOCAINE-MENTHOL 20-0.5 % EX AERO
1.0000 "application " | INHALATION_SPRAY | CUTANEOUS | Status: DC | PRN
  Administered 2019-01-29: 1 via TOPICAL
  Filled 2019-01-29: qty 56

## 2019-01-29 MED ORDER — OXYTOCIN BOLUS FROM INFUSION
500.0000 mL | Freq: Once | INTRAVENOUS | Status: AC
  Administered 2019-01-29: 500 mL via INTRAVENOUS

## 2019-01-29 MED ORDER — ONDANSETRON HCL 4 MG/2ML IJ SOLN: 4.0000 mg | INTRAMUSCULAR | Status: DC | PRN

## 2019-01-29 MED ORDER — WITCH HAZEL-GLYCERIN EX PADS: 1.0000 "application " | MEDICATED_PAD | CUTANEOUS | Status: DC | PRN

## 2019-01-29 MED ORDER — OXYCODONE-ACETAMINOPHEN 5-325 MG PO TABS: 2.0000 | ORAL_TABLET | ORAL | Status: DC | PRN

## 2019-01-29 MED ORDER — TETANUS-DIPHTH-ACELL PERTUSSIS 5-2.5-18.5 LF-MCG/0.5 IM SUSP: 0.5000 mL | Freq: Once | INTRAMUSCULAR | Status: DC

## 2019-01-29 MED ORDER — LIDOCAINE HCL (PF) 1 % IJ SOLN
30.0000 mL | INTRAMUSCULAR | Status: AC | PRN
  Administered 2019-01-29: 30 mL via SUBCUTANEOUS
  Filled 2019-01-29: qty 30

## 2019-01-29 MED ORDER — SIMETHICONE 80 MG PO CHEW: 80.0000 mg | CHEWABLE_TABLET | ORAL | Status: DC | PRN

## 2019-01-29 MED ORDER — ONDANSETRON HCL 4 MG PO TABS: 4.0000 mg | ORAL_TABLET | ORAL | Status: DC | PRN

## 2019-01-29 MED ORDER — IBUPROFEN 600 MG PO TABS
600.0000 mg | ORAL_TABLET | Freq: Four times a day (QID) | ORAL | Status: DC
  Administered 2019-01-29 – 2019-01-30 (×5): 600 mg via ORAL
  Filled 2019-01-29 (×5): qty 1

## 2019-01-29 MED ORDER — FENTANYL CITRATE (PF) 100 MCG/2ML IJ SOLN
50.0000 ug | INTRAMUSCULAR | Status: DC | PRN
  Administered 2019-01-29: 100 ug via INTRAVENOUS

## 2019-01-29 MED ORDER — DIBUCAINE (PERIANAL) 1 % EX OINT: 1.0000 "application " | TOPICAL_OINTMENT | CUTANEOUS | Status: DC | PRN

## 2019-01-29 MED ORDER — LACTATED RINGERS IV SOLN: 500.0000 mL | INTRAVENOUS | Status: DC | PRN

## 2019-01-29 NOTE — H&P (Signed)
Stacey Graves is a 38 y.o. female presenting for labor  38 yo [phone] @ 39+6 presents for labor. She was scheduled to be induced the morning of admission for AMA. SHe has a history of cesarean section with her first pregnancy and 3 subsequent successful VBACs. Her pregnancy has been complicated by AMA. OB History    Gravida  5   Para  4   Term  3   Preterm  1   AB      Living  4     SAB      TAB      Ectopic      Multiple  0   Live Births  4          Past Medical History:  Diagnosis Date  . Asthma   . Medical history non-contributory   . No pertinent past medical history   . Preterm delivery    Past Surgical History:  Procedure Laterality Date  . CESAREAN SECTION  02/08/2011   Procedure: CESAREAN SECTION;  Surgeon: Paulo Fruit;  Location: [place] [facility];  Service: Gynecology;  Laterality: N/A;   Family History: family history includes Asthma in her father; Hypertension in her mother. Social History:  reports that she has never smoked. She has never used smokeless tobacco. She reports that she does not drink alcohol or use drugs.     Maternal Diabetes: No, failed early 1 hr, normal 3 hr Genetic Screening: Normal Maternal Ultrasounds/Referrals: Normal Fetal Ultrasounds or other Referrals:  None Maternal Substance Abuse:  No Significant Maternal Medications:  None Significant Maternal Lab Results:  None Other Comments:  None  Review of Systems History Dilation: 10 Effacement (%): 100 Station: 0 Exam by:: middleton rn Blood pressure 132/67, pulse 96, temperature 98 F (36.7 C), temperature source Oral, resp. rate 16, height 5\' 3"  (1.6 m), weight 62.3 kg, unknown if currently breastfeeding. Exam Physical Exam  Prenatal labs: ABO, Rh: --/--/A NEG (12/23 0528) Antibody: NEG (12/23 0528) Rubella:  imm RPR:   NR HBsAg:   Neg HIV:   NR GBS:   Neg  Assessment/Plan: 1) Admit 2) Epidural on request 3) Anticipate SVD   [name] 01/29/2019, 9:53 AM

## 2019-01-29 NOTE — Plan of Care (Signed)
Transfer to [facility]

## 2019-01-29 NOTE — Lactation Note (Signed)
This note was copied from a baby's chart. Lactation Consultation Note  Patient Name: Stacey Graves Today's Date: 01/29/2019 Reason for consult: Initial assessment;Term  7 hours old FT female who is being exclusively BF by her mother, she's a P5 and experienced BF. She BF her first child for 14 months, the second one for 15 months, the third one for 18 months and the last one for 14 months. She participated in the [facility] program at the [facility] and she's already familiar with hand expression. When LC revised hand expression with mom, she was able to do teach back and easily get lots of big drops of colostrum, praised her for her efforts.   Offered assistance with latch, but mom politely declined stating that baby already fed for 20 minutes. Asked mom to call for assistance when needed. Reviewed normal newborn behavior and feeding cues.   Feeding plan:  1. Encouraged mom to feed baby STS 8-12 times/24 hours or sooner if feeding cues are present 2. Hand expression and spoon feeding were also encouraged  BF brochure, BF resources and feeding diary were reviewed. Mom didn't have a support person in the room at the time of [facility] consultation. She reported all questions and concerns were answered, she's aware of [facility] OP services and will call PRN.   Maternal Data Formula Feeding for Exclusion: No Has patient been taught Hand Expression?: Yes Does the patient have breastfeeding experience prior to this delivery?: Yes  Feeding Feeding Type: Breast Fed  LATCH Score                   Interventions Interventions: Breast feeding basics reviewed;Breast massage;Hand express;Breast compression  Lactation Tools Discussed/Used WIC Program: Yes   Consult Status Consult Status: Follow-up Date: 01/30/19 Follow-up type: In-patient    Stacey Graves 01/29/2019, 5:00 PM

## 2019-01-29 NOTE — MAU Note (Signed)
Patient reports contractions every 5-20 mins.  No LOF/VB.  Endorses + FM.  Prev C/s-successful VBAC  X3.

## 2019-01-30 ENCOUNTER — Encounter (HOSPITAL_COMMUNITY): Payer: Medicaid Other

## 2019-01-30 LAB — CBC
HCT: 37.7 % (ref 36.0–46.0)
Hemoglobin: 12.5 g/dL (ref 12.0–15.0)
MCH: 31.1 pg (ref 26.0–34.0)
MCHC: 33.2 g/dL (ref 30.0–36.0)
MCV: 93.8 fL (ref 80.0–100.0)
Platelets: 252 10*3/uL (ref 150–400)
RBC: 4.02 MIL/uL (ref 3.87–5.11)
RDW: 13.6 % (ref 11.5–15.5)
WBC: 9.1 10*3/uL (ref 4.0–10.5)
nRBC: 0 % (ref 0.0–0.2)

## 2019-01-30 LAB — KLEIHAUER-BETKE STAIN
# Vials RhIg: 1
Fetal Cells %: 0 %
Quantitation Fetal Hemoglobin: 0 mL

## 2019-01-30 MED ORDER — ACETAMINOPHEN 325 MG PO TABS: 650.0000 mg | ORAL_TABLET | ORAL | 1 refills | Status: DC | PRN

## 2019-01-30 MED ORDER — IBUPROFEN 600 MG PO TABS: 600.0000 mg | ORAL_TABLET | Freq: Four times a day (QID) | ORAL | 1 refills | Status: DC

## 2019-01-30 MED ORDER — RHO D IMMUNE GLOBULIN 1500 UNIT/2ML IJ SOSY
300.0000 ug | PREFILLED_SYRINGE | Freq: Once | INTRAMUSCULAR | Status: AC
  Administered 2019-01-30: 300 ug via INTRAVENOUS
  Filled 2019-01-30: qty 2

## 2019-01-30 NOTE — Progress Notes (Signed)
Patient is doing well.  She is ambulating, voiding, tolerating PO.  Pain control is good.  Lochia is appropriate  Vitals:   01/29/19 1143 01/29/19 1530 01/29/19 1943 01/30/19 0538  BP: 108/68 (!) 147/85 101/68 116/64  Pulse: 67 64 61 65  Resp: 18 18 16 16   Temp: 98.6 F (37 C) 98.1 F (36.7 C) 98.3 F (36.8 C) 97.7 F (36.5 C)  TempSrc:  Oral Oral Oral  SpO2:    100%  Weight:      Height:        NAD Fundus firm Ext: no  Lab Results  Component Value Date   WBC 9.1 01/30/2019   HGB 12.5 01/30/2019   HCT 37.7 01/30/2019   MCV 93.8 01/30/2019   PLT 252 01/30/2019    --/--/A NEG (12/23 0528)/RImmune  A/P 38 y.o. [id number] PPD#1. Routine care.   Rh negative--baby Rh positive--rhogam prior to discharge Meeting all goals.  Discharge to home today.   [place]

## 2019-01-30 NOTE — Lactation Note (Signed)
This note was copied from a baby's chart. Lactation Consultation Note  Patient Name: Girl Darcy Rieger Today's Date: 01/30/2019  mom reports they are being d/c today.  Infant now 72 hours old.  Reports they are breastfeeding well.  Mom asked so I have to feed her every 2-2 and a half hours?  I answered to feed her on cue and 8-12 times day.  Reviewed hunger cues with mom.  Mom has [facility] breastfeeding [facility]ation [facility] handouts for home use.Urged her to call lactation as needed  Maternal Data    Feeding Feeding Type: Breast Fed  [facility] Score                   Interventions    Lactation Tools Discussed/Used     Consult Status      Rollen Sox 01/30/2019, 11:55 AM

## 2019-01-30 NOTE — Discharge Summary (Signed)
Obstetric Discharge Summary Reason for Admission: onset of labor Prenatal Procedures: none Intrapartum Procedures: spontaneous vaginal delivery and vbac Postpartum Procedures: Rho(D) Ig Complications-Operative and Postpartum: 1 degree perineal laceration Hemoglobin  Date Value Ref Range Status  01/30/2019 12.5 12.0 - 15.0 g/dL Final   HCT  Date Value Ref Range Status  01/30/2019 37.7 36.0 - 46.0 % Final    Physical Exam:  General: alert, cooperative and appears stated age 38: appropriate Uterine Fundus: firm DVT Evaluation: No evidence of DVT seen on physical exam.  Discharge Diagnoses: Term Pregnancy-delivered  Discharge Information: Date: 01/30/2019 Activity: pelvic rest Diet: routine Medications: PNV and Ibuprofen Condition: stable Instructions: refer to practice specific booklet Discharge to: home Follow-up Information    [name], MD Follow up in 4 week(s).   Specialty: Obstetrics and Gynecology Contact information: [place] [address] [phone]           Newborn Data: Live born female  Birth Weight: 6 lb 9.1 oz (2980 g) APGAR: 8, 9  Newborn Delivery   Birth date/time: 01/29/2019 09:05:00 Delivery type: VBAC, Spontaneous      Home with mother.  [place] 01/30/2019, 10:00 AM

## 2019-01-31 LAB — RH IG WORKUP (INCLUDES ABO/RH)
ABO/RH(D): A NEG
Gestational Age(Wks): 39
Unit division: 0

## 2019-05-19 ENCOUNTER — Ambulatory Visit: Payer: Medicaid Other | Attending: Internal Medicine

## 2019-05-19 DIAGNOSIS — Z23 Encounter for immunization: Secondary | ICD-10-CM

## 2019-05-19 NOTE — Progress Notes (Signed)
   Covid-19 Vaccination Clinic  Name:  Stacey Graves    MRN: [id number] DOB: 1981-01-26  05/19/2019  Stacey Graves was observed post Covid-19 immunization for 15 minutes without incident. She was provided with Vaccine Information Sheet and instruction to access the V-Safe system.   Stacey Graves was instructed to call 911 with any severe reactions post vaccine: Marland Kitchen Difficulty breathing  . Swelling of face and throat  . A fast heartbeat  . A bad rash all over body  . Dizziness and weakness   Immunizations Administered    Name Date Dose VIS Date Route   Pfizer COVID-19 Vaccine 05/19/2019  1:49 PM 0.3 mL 01/17/2019 Intramuscular   Manufacturer: [employer], [employer]   Lot: [id number]   NDC: [address]-9

## 2019-06-09 ENCOUNTER — Ambulatory Visit: Payer: Medicaid Other | Attending: Internal Medicine

## 2019-06-09 DIAGNOSIS — Z23 Encounter for immunization: Secondary | ICD-10-CM

## 2019-06-09 NOTE — Progress Notes (Signed)
   Covid-19 Vaccination Clinic  Name:  Stacey Graves    MRN: [id number] DOB: 1980/03/10  06/09/2019  Ms. Overall was observed post Covid-19 immunization for 15 minutes without incident. She was provided with Vaccine Information Sheet and instruction to access the V-Safe system.   [name] was instructed to call 911 with any severe reactions post vaccine: Marland Kitchen Difficulty breathing  . Swelling of face and throat  . A fast heartbeat  . A bad rash all over body  . Dizziness and weakness   Immunizations Administered    Name Date Dose VIS Date Route   Pfizer COVID-19 Vaccine 06/09/2019  2:45 PM 0.3 mL 04/02/2018 Intramuscular   Manufacturer: [employer], [employer]   Lot: [id number]   NDC: [address]-5

## 2020-10-01 LAB — OB RESULTS CONSOLE HEPATITIS B SURFACE ANTIGEN: Hepatitis B Surface Ag: NEGATIVE

## 2020-10-01 LAB — OB RESULTS CONSOLE HIV ANTIBODY (ROUTINE TESTING): HIV: NONREACTIVE

## 2020-10-01 LAB — OB RESULTS CONSOLE ABO/RH: RH Type: NEGATIVE

## 2020-10-01 LAB — OB RESULTS CONSOLE RUBELLA ANTIBODY, IGM: Rubella: IMMUNE

## 2020-10-01 LAB — OB RESULTS CONSOLE GC/CHLAMYDIA
Chlamydia: NEGATIVE
Gonorrhea: NEGATIVE

## 2020-10-01 LAB — OB RESULTS CONSOLE ANTIBODY SCREEN: Antibody Screen: NEGATIVE

## 2020-10-01 LAB — OB RESULTS CONSOLE RPR: RPR: NONREACTIVE

## 2020-10-01 LAB — HEPATITIS C ANTIBODY: HCV Ab: NEGATIVE

## 2020-11-24 ENCOUNTER — Other Ambulatory Visit: Payer: Self-pay

## 2020-11-24 ENCOUNTER — Encounter: Payer: Medicaid Other | Attending: Obstetrics and Gynecology | Admitting: Registered"

## 2020-11-24 DIAGNOSIS — O24419 Gestational diabetes mellitus in pregnancy, unspecified control: Secondary | ICD-10-CM | POA: Insufficient documentation

## 2020-11-26 ENCOUNTER — Encounter: Payer: Self-pay | Admitting: Registered"

## 2020-11-26 DIAGNOSIS — O24419 Gestational diabetes mellitus in pregnancy, unspecified control: Secondary | ICD-10-CM | POA: Insufficient documentation

## 2020-11-26 NOTE — Progress Notes (Signed)
Patient was seen on 11/24/20 for Gestational Diabetes self-management class at [facility]. The following learning objectives were met by the patient during this course:  States the definition of Gestational Diabetes States why dietary management is important in controlling blood glucose Describes the effects each nutrient has on blood glucose levels Demonstrates ability to create a balanced meal plan Demonstrates carbohydrate counting  States when to check blood glucose levels Demonstrates proper blood glucose monitoring techniques States the effect of stress and exercise on blood glucose levels States the importance of limiting caffeine and abstaining from alcohol and smoking  Blood glucose monitor given: Accu-chek Guide Me Lot #[id number] Exp: 02/17/2022 CBG: 79 mg/dL  Patient instructed to monitor glucose levels: FBS: 60 - <95; 1 hour: <140; 2 hour: <120  Patient received handouts: Nutrition Diabetes and Pregnancy, including carb counting list  Patient will be seen for follow-up as needed.

## 2021-02-06 NOTE — L&D Delivery Note (Signed)
Patient was C/C/+3 and pushed for <10 minutes with epidural.    ?NSVD  female infant, Apgars 9,9, weight P.   ?The patient had very small first degree perineal laceration repaired with 2-0 vicryl R. ?Fundus was firm. ?EBL was expected amount. ?Placenta was delivered intact. ?Vagina was clear. ? ?Delayed cord clamping done for 30-60 seconds while warming baby. ?Baby was vigorous and doing skin to skin with mother. ? ?Loney Laurence  ?

## 2021-03-01 ENCOUNTER — Other Ambulatory Visit: Payer: Self-pay | Admitting: Obstetrics and Gynecology

## 2021-03-01 ENCOUNTER — Other Ambulatory Visit: Payer: Self-pay

## 2021-03-01 DIAGNOSIS — Z363 Encounter for antenatal screening for malformations: Secondary | ICD-10-CM

## 2021-03-02 ENCOUNTER — Other Ambulatory Visit: Payer: Self-pay

## 2021-03-02 ENCOUNTER — Other Ambulatory Visit: Payer: Self-pay | Admitting: *Deleted

## 2021-03-02 ENCOUNTER — Ambulatory Visit: Payer: Medicaid Other | Admitting: *Deleted

## 2021-03-02 ENCOUNTER — Ambulatory Visit: Payer: Medicaid Other | Attending: Obstetrics and Gynecology

## 2021-03-02 VITALS — BP 108/61 | HR 79

## 2021-03-02 DIAGNOSIS — O09523 Supervision of elderly multigravida, third trimester: Secondary | ICD-10-CM | POA: Insufficient documentation

## 2021-03-02 DIAGNOSIS — O34219 Maternal care for unspecified type scar from previous cesarean delivery: Secondary | ICD-10-CM

## 2021-03-02 DIAGNOSIS — Z363 Encounter for antenatal screening for malformations: Secondary | ICD-10-CM | POA: Diagnosis present

## 2021-03-02 DIAGNOSIS — O24410 Gestational diabetes mellitus in pregnancy, diet controlled: Secondary | ICD-10-CM

## 2021-03-23 ENCOUNTER — Ambulatory Visit: Payer: Medicaid Other | Attending: Obstetrics

## 2021-03-23 ENCOUNTER — Ambulatory Visit: Payer: Medicaid Other | Admitting: *Deleted

## 2021-03-23 ENCOUNTER — Encounter: Payer: Self-pay | Admitting: *Deleted

## 2021-03-23 ENCOUNTER — Other Ambulatory Visit: Payer: Self-pay

## 2021-03-23 VITALS — BP 105/55 | HR 71

## 2021-03-23 DIAGNOSIS — Z3A37 37 weeks gestation of pregnancy: Secondary | ICD-10-CM | POA: Diagnosis not present

## 2021-03-23 DIAGNOSIS — O34219 Maternal care for unspecified type scar from previous cesarean delivery: Secondary | ICD-10-CM | POA: Diagnosis present

## 2021-03-23 DIAGNOSIS — O09523 Supervision of elderly multigravida, third trimester: Secondary | ICD-10-CM

## 2021-03-23 DIAGNOSIS — O24410 Gestational diabetes mellitus in pregnancy, diet controlled: Secondary | ICD-10-CM | POA: Insufficient documentation

## 2021-03-25 LAB — OB RESULTS CONSOLE GBS: GBS: NEGATIVE

## 2021-03-31 ENCOUNTER — Telehealth (HOSPITAL_COMMUNITY): Payer: Self-pay | Admitting: *Deleted

## 2021-03-31 ENCOUNTER — Other Ambulatory Visit: Payer: Self-pay | Admitting: Obstetrics and Gynecology

## 2021-03-31 NOTE — Telephone Encounter (Signed)
Preadmission screen  

## 2021-04-01 ENCOUNTER — Telehealth (HOSPITAL_COMMUNITY): Payer: Self-pay | Admitting: *Deleted

## 2021-04-01 NOTE — Telephone Encounter (Signed)
Preadmission screen  

## 2021-04-04 ENCOUNTER — Encounter (HOSPITAL_COMMUNITY): Payer: Self-pay | Admitting: *Deleted

## 2021-04-04 ENCOUNTER — Telehealth (HOSPITAL_COMMUNITY): Payer: Self-pay | Admitting: *Deleted

## 2021-04-04 NOTE — Telephone Encounter (Signed)
Preadmission screen  

## 2021-04-06 ENCOUNTER — Other Ambulatory Visit: Payer: Self-pay | Admitting: Obstetrics and Gynecology

## 2021-04-06 LAB — SARS CORONAVIRUS 2 (TAT 6-24 HRS): SARS Coronavirus 2: NEGATIVE

## 2021-04-08 ENCOUNTER — Inpatient Hospital Stay (HOSPITAL_COMMUNITY): Payer: Medicaid Other

## 2021-04-08 ENCOUNTER — Other Ambulatory Visit: Payer: Self-pay

## 2021-04-08 ENCOUNTER — Encounter (HOSPITAL_COMMUNITY): Payer: Self-pay | Admitting: Obstetrics and Gynecology

## 2021-04-08 ENCOUNTER — Inpatient Hospital Stay (HOSPITAL_COMMUNITY)
Admission: AD | Admit: 2021-04-08 | Discharge: 2021-04-10 | DRG: 807 | Disposition: A | Discharge status: Home / Self Care | Payer: Medicaid Other | Attending: Obstetrics and Gynecology | Admitting: Obstetrics and Gynecology

## 2021-04-08 DIAGNOSIS — Z6791 Unspecified blood type, Rh negative: Secondary | ICD-10-CM | POA: Diagnosis not present

## 2021-04-08 DIAGNOSIS — Z3A39 39 weeks gestation of pregnancy: Secondary | ICD-10-CM | POA: Diagnosis not present

## 2021-04-08 DIAGNOSIS — O26893 Other specified pregnancy related conditions, third trimester: Secondary | ICD-10-CM | POA: Diagnosis present

## 2021-04-08 DIAGNOSIS — O09529 Supervision of elderly multigravida, unspecified trimester: Principal | ICD-10-CM

## 2021-04-08 DIAGNOSIS — O34219 Maternal care for unspecified type scar from previous cesarean delivery: Secondary | ICD-10-CM | POA: Diagnosis present

## 2021-04-08 DIAGNOSIS — O24420 Gestational diabetes mellitus in childbirth, diet controlled: Secondary | ICD-10-CM | POA: Diagnosis present

## 2021-04-08 LAB — TYPE AND SCREEN
ABO/RH(D): A NEG
Antibody Screen: NEGATIVE

## 2021-04-08 LAB — GLUCOSE, CAPILLARY: Glucose-Capillary: 77 mg/dL (ref 70–99)

## 2021-04-08 LAB — CBC
HCT: 40.4 % (ref 36.0–46.0)
Hemoglobin: 13.5 g/dL (ref 12.0–15.0)
MCH: 31 pg (ref 26.0–34.0)
MCHC: 33.4 g/dL (ref 30.0–36.0)
MCV: 92.7 fL (ref 80.0–100.0)
Platelets: 278 10*3/uL (ref 150–400)
RBC: 4.36 MIL/uL (ref 3.87–5.11)
RDW: 14.4 % (ref 11.5–15.5)
WBC: 6 10*3/uL (ref 4.0–10.5)
nRBC: 0 % (ref 0.0–0.2)

## 2021-04-08 LAB — RPR: RPR Ser Ql: NONREACTIVE

## 2021-04-08 MED ORDER — OXYTOCIN BOLUS FROM INFUSION
333.0000 mL | Freq: Once | INTRAVENOUS | Status: AC
  Administered 2021-04-09: 333 mL via INTRAVENOUS

## 2021-04-08 MED ORDER — ONDANSETRON HCL 4 MG/2ML IJ SOLN
4.0000 mg | Freq: Four times a day (QID) | INTRAMUSCULAR | Status: DC | PRN
  Administered 2021-04-08: 4 mg via INTRAVENOUS
  Filled 2021-04-08: qty 2

## 2021-04-08 MED ORDER — DIPHENHYDRAMINE HCL 50 MG/ML IJ SOLN: 12.5000 mg | INTRAMUSCULAR | Status: DC | PRN

## 2021-04-08 MED ORDER — EPHEDRINE 5 MG/ML INJ: 10.0000 mg | INTRAVENOUS | Status: DC | PRN

## 2021-04-08 MED ORDER — PHENYLEPHRINE 40 MCG/ML (10ML) SYRINGE FOR IV PUSH (FOR BLOOD PRESSURE SUPPORT): 80.0000 ug | PREFILLED_SYRINGE | INTRAVENOUS | Status: DC | PRN

## 2021-04-08 MED ORDER — FENTANYL-BUPIVACAINE-NACL 0.5-0.125-0.9 MG/250ML-% EP SOLN
12.0000 mL/h | EPIDURAL | Status: DC | PRN
  Administered 2021-04-09: 12 mL/h via EPIDURAL
  Filled 2021-04-08: qty 250

## 2021-04-08 MED ORDER — LACTATED RINGERS IV SOLN: INTRAVENOUS | Status: DC

## 2021-04-08 MED ORDER — BUTORPHANOL TARTRATE 1 MG/ML IJ SOLN
1.0000 mg | INTRAMUSCULAR | Status: DC | PRN
Start: 2021-04-08 — End: 2021-04-09
  Administered 2021-04-08: 1 mg via INTRAVENOUS
  Filled 2021-04-08: qty 1

## 2021-04-08 MED ORDER — OXYCODONE-ACETAMINOPHEN 5-325 MG PO TABS: 2.0000 | ORAL_TABLET | ORAL | Status: DC | PRN

## 2021-04-08 MED ORDER — FLEET ENEMA 7-19 GM/118ML RE ENEM: 1.0000 | ENEMA | RECTAL | Status: DC | PRN

## 2021-04-08 MED ORDER — BUTORPHANOL TARTRATE 1 MG/ML IJ SOLN
1.0000 mg | INTRAMUSCULAR | Status: AC | PRN
  Administered 2021-04-08: 1 mg via INTRAVENOUS
  Filled 2021-04-08 (×2): qty 1

## 2021-04-08 MED ORDER — TERBUTALINE SULFATE 1 MG/ML IJ SOLN: 0.2500 mg | Freq: Once | INTRAMUSCULAR | Status: DC | PRN

## 2021-04-08 MED ORDER — LIDOCAINE HCL (PF) 1 % IJ SOLN: 30.0000 mL | INTRAMUSCULAR | Status: DC | PRN

## 2021-04-08 MED ORDER — LACTATED RINGERS IV SOLN: 500.0000 mL | Freq: Once | INTRAVENOUS | Status: DC

## 2021-04-08 MED ORDER — SOD CITRATE-CITRIC ACID 500-334 MG/5ML PO SOLN: 30.0000 mL | ORAL | Status: DC | PRN

## 2021-04-08 MED ORDER — OXYTOCIN-SODIUM CHLORIDE 30-0.9 UT/500ML-% IV SOLN
1.0000 m[IU]/min | INTRAVENOUS | Status: DC
  Administered 2021-04-08: 1 m[IU]/min via INTRAVENOUS
  Filled 2021-04-08: qty 500

## 2021-04-08 MED ORDER — ACETAMINOPHEN 325 MG PO TABS: 650.0000 mg | ORAL_TABLET | ORAL | Status: DC | PRN

## 2021-04-08 MED ORDER — LACTATED RINGERS IV SOLN
500.0000 mL | INTRAVENOUS | Status: DC | PRN
  Administered 2021-04-08: 500 mL via INTRAVENOUS

## 2021-04-08 MED ORDER — OXYTOCIN-SODIUM CHLORIDE 30-0.9 UT/500ML-% IV SOLN
2.5000 [IU]/h | INTRAVENOUS | Status: DC
Start: 2021-04-08 — End: 2021-04-09

## 2021-04-08 MED ORDER — OXYCODONE-ACETAMINOPHEN 5-325 MG PO TABS: 1.0000 | ORAL_TABLET | ORAL | Status: DC | PRN

## 2021-04-08 MED ORDER — OXYTOCIN-SODIUM CHLORIDE 30-0.9 UT/500ML-% IV SOLN: 1.0000 m[IU]/min | INTRAVENOUS | Status: DC

## 2021-04-08 NOTE — H&P (Signed)
41 y.o. [redacted]w[redacted]d  [id number] comes in for induction at term for AMA.  Otherwise has good fetal movement and no bleeding. ? ?Past Medical History:  ?Diagnosis Date  ? Asthma   ? Medical history non-contributory   ? No pertinent past medical history   ? Preterm delivery   ?  ?Past Surgical History:  ?Procedure Laterality Date  ? CESAREAN SECTION  02/08/2011  ? Procedure: CESAREAN SECTION;  Surgeon: Caprice Beaver;  Location: [facility];  Service: Gynecology;  Laterality: N/A;  ?  ?OB History  ?Gravida Para Term Preterm AB Living  ?6 5 4 1   5   ?SAB IAB Ectopic Multiple Live Births  ?      0 5  ?  ?# Outcome Date GA Lbr Len/2nd Weight Sex Delivery Anes PTL Lv  ?6 Current           ?5 Term 01/29/19 [redacted]w[redacted]d 01:07 / 00:13 2980 g F VBAC None  LIV  ?4 Term 04/19/17 [redacted]w[redacted]d 00:43 / 00:10 2845 g F VBAC Local  LIV  ?3 Term 12/30/14 [redacted]w[redacted]d 04:54 / 00:08 3096 g M Vag-Spont None  LIV  ?2 Term 02/20/13 [redacted]w[redacted]d 07:21 / 03:20 2849 g F VBAC EPI  LIV  ?1 Preterm 02/08/11 [redacted]w[redacted]d  1923 g F CS-LTranv Gen  LIV  ?  ?Social History  ? ?Socioeconomic History  ? Marital status: Married  ?  Spouse name: Not on file  ? Number of children: Not on file  ? Years of education: Not on file  ? Highest education level: Not on file  ?Occupational History  ? Not on file  ?Tobacco Use  ? Smoking status: Never  ? Smokeless tobacco: Never  ?Vaping Use  ? Vaping Use: Never used  ?Substance and Sexual Activity  ? Alcohol use: No  ? Drug use: No  ? Sexual activity: Yes  ?  Birth control/protection: None  ?Other Topics Concern  ? Not on file  ?Social History Narrative  ? Not on file  ? ?Social Determinants of Health  ? ?Financial Resource Strain: Not on file  ?Food Insecurity: No Food Insecurity  ? Worried About [redacted]w[redacted]d in the Last Year: Never true  ? Ran Out of Food in the Last Year: Never true  ?Transportation Needs: Not on file  ?Physical Activity: Not on file  ?Stress: Not on file  ?Social Connections: Not on file  ?Intimate Partner Violence: Not on file  ? Patient  has no known allergies.  ? ? ?Prenatal Transfer Tool  ?Maternal Diabetes: Yes:  Diabetes Type:  Pre-pregnancy ?Genetic Screening: Normal ?Maternal Ultrasounds/Referrals: Normal ?Fetal Ultrasounds or other Referrals:  None ?Maternal Substance Abuse:  No ?Significant Maternal Medications:  None ?Significant Maternal Lab Results: Group B Strep negative ? ?Other PNC: uncomplicated. ? ? ? ?Vitals:  ? 04/08/21 1111 04/08/21 1112  ?BP:  105/72  ?Pulse:  80  ?Temp:  98.6 ?F (37 ?C)  ?TempSrc:  Oral  ?Weight: 66.7 kg   ?Height: 5\' 3"  (1.6 m)   ? ? ?Lungs/Cor:  NAD ?Abdomen:  soft, gravid ?Ex:  no cords, erythema ?SVE:  3/60/-3 ?FHTs:  140s, good STV, NST R; Cat 1 tracing. ?Toco:  q 3 ? ? ?A/P   AMA term induction - VBAC x5.  Likely prepreg DM but no meds.  Check initial sugar only if normal.  Pit 1x1.  RH neg but no antibody sensitization, s/p Rhogam.  ? GBS neg. ? ?06/08/21  ?

## 2021-04-09 ENCOUNTER — Inpatient Hospital Stay (HOSPITAL_COMMUNITY): Payer: Medicaid Other | Admitting: Anesthesiology

## 2021-04-09 ENCOUNTER — Encounter (HOSPITAL_COMMUNITY): Payer: Self-pay | Admitting: Obstetrics and Gynecology

## 2021-04-09 LAB — CBC
HCT: 32.3 % — ABNORMAL LOW (ref 36.0–46.0)
Hemoglobin: 10.8 g/dL — ABNORMAL LOW (ref 12.0–15.0)
MCH: 31.2 pg (ref 26.0–34.0)
MCHC: 33.4 g/dL (ref 30.0–36.0)
MCV: 93.4 fL (ref 80.0–100.0)
Platelets: 211 10*3/uL (ref 150–400)
RBC: 3.46 MIL/uL — ABNORMAL LOW (ref 3.87–5.11)
RDW: 14.6 % (ref 11.5–15.5)
WBC: 11.2 10*3/uL — ABNORMAL HIGH (ref 4.0–10.5)
nRBC: 0 % (ref 0.0–0.2)

## 2021-04-09 MED ORDER — BENZOCAINE-MENTHOL 20-0.5 % EX AERO: 1.0000 "application " | INHALATION_SPRAY | CUTANEOUS | Status: DC | PRN

## 2021-04-09 MED ORDER — METHYLERGONOVINE MALEATE 0.2 MG PO TABS: 0.2000 mg | ORAL_TABLET | ORAL | Status: DC | PRN

## 2021-04-09 MED ORDER — SODIUM CHLORIDE 0.9 % IV SOLN: 250.0000 mL | INTRAVENOUS | Status: DC | PRN

## 2021-04-09 MED ORDER — SODIUM CHLORIDE 0.9% FLUSH: 3.0000 mL | INTRAVENOUS | Status: DC | PRN

## 2021-04-09 MED ORDER — FERROUS SULFATE 325 (65 FE) MG PO TABS
325.0000 mg | ORAL_TABLET | Freq: Two times a day (BID) | ORAL | Status: DC
  Administered 2021-04-09 – 2021-04-10 (×3): 325 mg via ORAL
  Filled 2021-04-09 (×3): qty 1

## 2021-04-09 MED ORDER — ACETAMINOPHEN 325 MG PO TABS: 650.0000 mg | ORAL_TABLET | ORAL | Status: DC | PRN

## 2021-04-09 MED ORDER — ONDANSETRON HCL 4 MG/2ML IJ SOLN: 4.0000 mg | INTRAMUSCULAR | Status: DC | PRN

## 2021-04-09 MED ORDER — SENNOSIDES-DOCUSATE SODIUM 8.6-50 MG PO TABS
2.0000 | ORAL_TABLET | Freq: Every day | ORAL | Status: DC
  Administered 2021-04-10: 2 via ORAL
  Filled 2021-04-09: qty 2

## 2021-04-09 MED ORDER — ONDANSETRON HCL 4 MG PO TABS: 4.0000 mg | ORAL_TABLET | ORAL | Status: DC | PRN

## 2021-04-09 MED ORDER — OXYCODONE-ACETAMINOPHEN 5-325 MG PO TABS: 2.0000 | ORAL_TABLET | ORAL | Status: DC | PRN

## 2021-04-09 MED ORDER — MEASLES, MUMPS & RUBELLA VAC IJ SOLR: 0.5000 mL | Freq: Once | INTRAMUSCULAR | Status: DC

## 2021-04-09 MED ORDER — IBUPROFEN 800 MG PO TABS
800.0000 mg | ORAL_TABLET | Freq: Three times a day (TID) | ORAL | Status: DC
  Administered 2021-04-09 – 2021-04-10 (×4): 800 mg via ORAL
  Filled 2021-04-09 (×4): qty 1

## 2021-04-09 MED ORDER — MAGNESIUM HYDROXIDE 400 MG/5ML PO SUSP: 30.0000 mL | ORAL | Status: DC | PRN

## 2021-04-09 MED ORDER — LIDOCAINE HCL (PF) 1 % IJ SOLN
INTRAMUSCULAR | Status: DC | PRN
  Administered 2021-04-09: 6 mL via EPIDURAL

## 2021-04-09 MED ORDER — SIMETHICONE 80 MG PO CHEW: 80.0000 mg | CHEWABLE_TABLET | ORAL | Status: DC | PRN

## 2021-04-09 MED ORDER — METHYLERGONOVINE MALEATE 0.2 MG/ML IJ SOLN: 0.2000 mg | INTRAMUSCULAR | Status: DC | PRN

## 2021-04-09 MED ORDER — WITCH HAZEL-GLYCERIN EX PADS: 1.0000 "application " | MEDICATED_PAD | CUTANEOUS | Status: DC | PRN

## 2021-04-09 MED ORDER — SODIUM CHLORIDE 0.9% FLUSH
3.0000 mL | Freq: Two times a day (BID) | INTRAVENOUS | Status: DC
  Administered 2021-04-10: 3 mL via INTRAVENOUS

## 2021-04-09 MED ORDER — DIPHENHYDRAMINE HCL 25 MG PO CAPS: 25.0000 mg | ORAL_CAPSULE | Freq: Four times a day (QID) | ORAL | Status: DC | PRN

## 2021-04-09 MED ORDER — DIBUCAINE (PERIANAL) 1 % EX OINT: 1.0000 "application " | TOPICAL_OINTMENT | CUTANEOUS | Status: DC | PRN

## 2021-04-09 MED ORDER — TETANUS-DIPHTH-ACELL PERTUSSIS 5-2.5-18.5 LF-MCG/0.5 IM SUSY: 0.5000 mL | PREFILLED_SYRINGE | Freq: Once | INTRAMUSCULAR | Status: DC

## 2021-04-09 MED ORDER — PRENATAL MULTIVITAMIN CH
1.0000 | ORAL_TABLET | Freq: Every day | ORAL | Status: DC
  Administered 2021-04-09: 1 via ORAL
  Filled 2021-04-09: qty 1

## 2021-04-09 MED ORDER — ZOLPIDEM TARTRATE 5 MG PO TABS: 5.0000 mg | ORAL_TABLET | Freq: Every evening | ORAL | Status: DC | PRN

## 2021-04-09 MED ORDER — COCONUT OIL OIL: 1.0000 "application " | TOPICAL_OIL | Status: DC | PRN

## 2021-04-09 NOTE — Lactation Note (Signed)
This note was copied from a baby's chart. ?Lactation Consultation Note ? ?Patient Name: [name] ?Today's Date: 04/09/2021 ?Reason for consult: Initial assessment;Term;Other (Comment) (P 5 experienced BF of 5 babies . per the baby fed well around 9 am . LC encouraged mom to call with feeding cues) ?Age:40 hours ?Latch score 10 by RN earlier.  ? ?Maternal Data ?Has patient been taught Hand Expression?:  (per mom feels comfortable doing hand expressing) ?Per mom BF 5 other babies 1- 1/2 years each. Her 1st baby was in NICU.  ? ?Feeding ?Mother's Current Feeding Choice: Breast Milk ? ?LATCH Score - Latch score by the [facility]  ?Latch: Grasps breast easily, tongue down, lips flanged, rhythmical sucking. ? ?Audible Swallowing: Spontaneous and intermittent ? ?Type of Nipple: Everted at rest and after stimulation ? ?Comfort (Breast/Nipple): Soft / non-tender ? ?Hold (Positioning): No assistance needed to correctly position infant at breast. ? ?LATCH Score: 10 ? ? ?Lactation Tools Discussed/Used ?  ? ?Interventions ?  ? ?Discharge ?WIC Program: Yes ? ?Consult Status ?Consult Status: Follow-up ?Date: 04/09/21 ?Follow-up type: In-patient ? ? ? ?Matilde Sprang Akylah Hascall ?04/09/2021, 11:22 AM ? ? ? ?

## 2021-04-09 NOTE — Anesthesia Postprocedure Evaluation (Signed)
Anesthesia Post Note ? ?Patient: Stacey Graves ? ?Procedure(s) Performed: AN AD HOC LABOR EPIDURAL ? ?  ? ?Patient location during evaluation: Mother Baby ?Anesthesia Type: Epidural ?Level of consciousness: awake ?Pain management: satisfactory to patient ?Vital Signs Assessment: post-procedure vital signs reviewed and stable ?Respiratory status: spontaneous breathing ?Cardiovascular status: stable ?Anesthetic complications: no ? ? ?No notable events documented. ? ?Last Vitals:  ?Vitals:  ? 04/09/21 0658 04/09/21 1030  ?BP: 103/65 (!) 93/50  ?Pulse: 70 83  ?Resp: 18 18  ?Temp: 36.7 ?C 36.7 ?C  ?SpO2: 100% 100%  ?  ?Last Pain:  ?Vitals:  ? 04/09/21 1030  ?TempSrc: Oral  ?PainSc: 0-No pain  ? ?Pain Goal:   ? ?  ?  ?  ?  ?  ?  ?  ? ?Safi Culotta ? ? ? ? ?

## 2021-04-09 NOTE — Progress Notes (Signed)
Patient is doing well.  She is ambulating, voiding, tolerating PO.  Pain control is good.  Lochia is appropriate ? ?Vitals:  ? 04/09/21 0415 04/09/21 0500 04/09/21 0539 04/09/21 4196  ?BP: (!) 107/55 (!) 107/58 102/65 103/65  ?Pulse: 79 65 64 70  ?Resp:   17 18  ?Temp:   98.2 ?F (36.8 ?C) 98 ?F (36.7 ?C)  ?TempSrc:   Oral Oral  ?SpO2:   100% 100%  ?Weight:      ?Height:      ? ? ?NAD ?Fundus firm ?Ext: no edema ? ?Lab Results  ?Component Value Date  ? WBC 6.0 04/08/2021  ? HGB 13.5 04/08/2021  ? HCT 40.4 04/08/2021  ? MCV 92.7 04/08/2021  ? PLT 278 04/08/2021  ? ? ?--/--/A NEG (03/03 1132)/RImmune ? ?A/P 41 y.o. [id number] PPD#0. ?Routine care.   ?GDMA1--will check fasting blood glucose ?Rh negative--rhogam as needed pending baby's blood typing ?Expect d/c tomorrow.   ? ?Yamato Kopf GEFFEL Kento Gossman ? ?

## 2021-04-09 NOTE — Anesthesia Preprocedure Evaluation (Signed)
Anesthesia Evaluation  ?Patient identified by MRN, date of birth, ID band ?Patient awake ? ? ? ?Reviewed: ?Allergy & Precautions, NPO status , Patient's Chart, lab work & pertinent test results ? ?Airway ?Mallampati: II ? ?TM Distance: >3 FB ?Neck ROM: Full ? ? ? Dental ?no notable dental hx. ? ?  ?Pulmonary ?asthma ,  ?  ?Pulmonary exam normal ?breath sounds clear to auscultation ? ? ? ? ? ? Cardiovascular ?hypertension, Normal cardiovascular exam ?Rhythm:Regular Rate:Normal ? ? ?  ?Neuro/Psych ?negative neurological ROS ? negative psych ROS  ? GI/Hepatic ?negative GI ROS, Neg liver ROS,   ?Endo/Other  ?diabetes, Gestational ? Renal/GU ?negative Renal ROS  ?negative genitourinary ?  ?Musculoskeletal ?negative musculoskeletal ROS ?(+)  ? Abdominal ?  ?Peds ?negative pediatric ROS ?(+)  Hematology ?negative hematology ROS ?(+)   ?Anesthesia Other Findings ? ? Reproductive/Obstetrics ?negative OB ROS ? ?  ? ? ? ? ? ? ? ? ? ? ? ? ? ?  ?  ? ? ? ? ? ? ? ? ?Anesthesia Physical ?Anesthesia Plan ? ?ASA: 2 ? ?Anesthesia Plan: Epidural  ? ?Post-op Pain Management:   ? ?Induction:  ? ?PONV Risk Score and Plan: 2 and Treatment may vary due to age or medical condition ? ?Airway Management Planned: Natural Airway ? ?Additional Equipment:  ? ?Intra-op Plan:  ? ?Post-operative Plan:  ? ?Informed Consent: I have reviewed the patients History and Physical, chart, labs and discussed the procedure including the risks, benefits and alternatives for the proposed anesthesia with the patient or authorized representative who has indicated his/her understanding and acceptance.  ? ? ? ? ? ?Plan Discussed with: Anesthesiologist ? ?Anesthesia Plan Comments:   ? ? ? ? ? ? ?Anesthesia Quick Evaluation ? ?

## 2021-04-09 NOTE — Lactation Note (Signed)
This note was copied from a baby's chart. ?Lactation Consultation Note ? ?Patient Name: Girl Yisell Delpino ?Today's Date: 04/09/2021 ?Reason for consult: Initial assessment;Term ?Age:40 hours ?P6, term female infant. ?LC entered the room, per mom it was time for infant to breastfeed. ?LC suggested mom BF infant skin to skin, mom latched infant on her right breast using the cradle hold position, infant latched with depth and mom was doing breast stimulation techniques to keep infant awake while breastfeeding. ?Infant BF for 15 minutes on mom's right breast as LC left mom was switching infant to her left breast. See latch score below-9. ?Mom knows to breastfeed infant according to hunger cues, 8 to 12+ or more times within 24 hours, skin to skin. ?Mom knows to call RN/LC if she has any breastfeeding questions, concerns or need further assistance with latching infant at the breast. ?Mom made aware of O/P services, breastfeeding support groups, community resources, and our phone # for post-discharge questions.   ? ?Maternal Data ?Has patient been taught Hand Expression?: Yes ?Does the patient have breastfeeding experience prior to this delivery?: Yes ?How long did the patient breastfeed?: Per mom, she BF all other 5 children for 18 months each. ? ?Feeding ?Mother's Current Feeding Choice: Breast Milk ? ?LATCH Score ?Latch: Grasps breast easily, tongue down, lips flanged, rhythmical sucking. ? ?Audible Swallowing: Spontaneous and intermittent ? ?Type of Nipple: Everted at rest and after stimulation ? ?Comfort (Breast/Nipple): Soft / non-tender ? ?Hold (Positioning): Assistance needed to correctly position infant at breast and maintain latch. ? ?LATCH Score: 9 ? ? ?Lactation Tools Discussed/Used ?  ? ?Interventions ?Interventions: Breast feeding basics reviewed;Assisted with latch;Skin to skin;Breast compression;Adjust position;Support pillows;Position options;Education;LC Services brochure ? ?Discharge ?  ? ?Consult  Status ?Consult Status: Follow-up ?Date: 04/10/21 ?Follow-up type: In-patient ? ? ? ?Danelle Earthly ?04/09/2021, 7:34 PM ? ? ? ?

## 2021-04-09 NOTE — Discharge Summary (Signed)
? ?  Postpartum Discharge Summary ? ?Date of Service updated 04/10/21 ? ?   ?Patient Name: Stacey Graves ?DOB: 1980/12/11 ?MRN: [id number] ? ?Date of admission: 04/08/2021 ?Delivery date:04/09/2021  ?Delivering provider: Bobbye Charleston  ?Date of discharge: 04/10/2021 ? ?Admitting diagnosis: AMA (advanced maternal age) multigravida 34+ [O09.529] ?Intrauterine pregnancy: [redacted]w[redacted]d     ?Secondary diagnosis:  Principal Problem: ?  AMA (advanced maternal age) multigravida 25+ ? ?Additional problems: GDMA1    ?Discharge diagnosis: Term Pregnancy Delivered                                              ?Post partum procedures: none ?Augmentation: AROM and Pitocin ?Complications: None ? ?Hospital course: Induction of Labor With Vaginal Delivery   ?41 y.o. yo [id number] at [redacted]w[redacted]d was admitted to the hospital 04/08/2021 for induction of labor.  Indication for induction: AMA.  Patient had an uncomplicated labor course as follows: ?Membrane Rupture Time/Date: 8:54 PM ,04/08/2021   ?Delivery Method:Vaginal, Spontaneous  ?Episiotomy: None  ?Lacerations:  1st degree;Perineal  ?Details of delivery can be found in separate delivery note.  Patient had a routine postpartum course. Patient is discharged home 04/10/21. ? ?Newborn Data: ?Birth date:04/09/2021  ?Birth time:3:36 AM  ?Gender:Female  ?Living status:Living  ?Apgars:9 ,9  ?Weight:3090 g  ? ? ? ?Rhophylac:No ? ? ?Physical exam  ?Vitals:  ? 04/09/21 1402 04/09/21 1751 04/09/21 2115 04/10/21 0424  ?BP: 110/78 101/67 90/60 101/66  ?Pulse: (!) 57 (!) 58 61 60  ?Resp: 18 18 18 18   ?Temp: 97.6 ?F (36.4 ?C) 97.6 ?F (36.4 ?C) 98.2 ?F (36.8 ?C) 98 ?F (36.7 ?C)  ?TempSrc: Oral Oral Oral Oral  ?SpO2: 100% 100% 100% 100%  ?Weight:      ?Height:      ? ?General: alert, cooperative, and no distress ?Lochia: appropriate ?Uterine Fundus: firm ?Incision: N/A ?DVT Evaluation: No evidence of DVT seen on physical exam. ?Labs: ?Lab Results  ?Component Value Date  ? WBC 11.2 (H) 04/09/2021  ? HGB 10.8 (L) 04/09/2021  ? HCT  32.3 (L) 04/09/2021  ? MCV 93.4 04/09/2021  ? PLT 211 04/09/2021  ? ?CMP Latest Ref Rng & Units 02/22/2013  ?Glucose 70 - 99 mg/dL 116(H)  ?BUN 6 - 23 mg/dL 8  ?Creatinine 0.50 - 1.10 mg/dL 0.69  ?Sodium 137 - 147 mEq/L 139  ?Potassium 3.7 - 5.3 mEq/L 4.0  ?Chloride 96 - 112 mEq/L 105  ?CO2 19 - 32 mEq/L 24  ?Calcium 8.4 - 10.5 mg/dL 8.1(L)  ?Total Protein 6.0 - 8.3 g/dL 5.4(L)  ?Total Bilirubin 0.3 - 1.2 mg/dL 0.3  ?Alkaline Phos 39 - 117 U/L 126(H)  ?AST 0 - 37 U/L 36  ?ALT 0 - 35 U/L 14  ? ?Edinburgh Score: ?Edinburgh Postnatal Depression Scale Screening Tool 04/09/2021  ?I have been able to laugh and see the funny side of things. 0  ?I have looked forward with enjoyment to things. 0  ?I have blamed myself unnecessarily when things went wrong. 0  ?I have been anxious or worried for no good reason. 0  ?I have felt scared or panicky for no good reason. 0  ?Things have been getting on top of me. 0  ?I have been so unhappy that I have had difficulty sleeping. 0  ?I have felt sad or miserable. 0  ?I have been so unhappy that I have been  crying. 0  ?The thought of harming myself has occurred to me. 0  ?Edinburgh Postnatal Depression Scale Total 0  ? ? ? ? ?After visit meds:  ?Allergies as of 04/10/2021   ?No Known Allergies ?  ? ?  ?Medication List  ?  ? ?TAKE these medications   ? ?ibuprofen 800 MG tablet ?Commonly known as: ADVIL ?Take 1 tablet (800 mg total) by mouth 3 (three) times daily. ?  ?prenatal multivitamin Tabs tablet ?Take 1 tablet by mouth daily. ?  ? ?  ? ? ? ?Discharge home in stable condition ?Infant Feeding: Breast ?Infant Disposition:home with mother ?Discharge instruction: per After Visit Summary and Postpartum booklet. ?Activity: Advance as tolerated. Pelvic rest for 6 weeks.  ?Diet: routine diet ?Anticipated Birth Control: Unsure ?Postpartum Appointment:4 weeks ?Additional Postpartum F/U:  n/a ?Future Appointments:No future appointments. ?Follow up Visit: ? Follow-up Information   ? ? Bobbye Charleston, MD Follow up in 4 week(s).   ?Specialty: Obstetrics and Gynecology ?Contact information: ?[address] ?[address] ?[place] [address] ?[phone] ? ? ?  ?  ? ?  ?  ? ?  ? ? ? ?  ? ?04/10/2021 ?Yaseen Gilberg GEFFEL Carlis Abbott, MD ? ? ?

## 2021-04-09 NOTE — Anesthesia Procedure Notes (Signed)
Epidural ?Patient location during procedure: OB ?Start time: 04/09/2021 1:03 AM ?End time: 04/09/2021 1:13 AM ? ?Staffing ?Anesthesiologist: Mellody Dance, MD ?Performed: anesthesiologist  ? ?Preanesthetic Checklist ?Completed: patient identified, IV checked, site marked, risks and benefits discussed, monitors and equipment checked, pre-op evaluation and timeout performed ? ?Epidural ?Patient position: sitting ?Prep: DuraPrep ?Patient monitoring: heart rate, cardiac monitor, continuous pulse ox and blood pressure ?Approach: midline ?Location: L2-L3 ?Injection technique: LOR saline ? ?Needle:  ?Needle type: Tuohy  ?Needle gauge: 17 G ?Needle length: 9 cm ?Needle insertion depth: 4 cm ?Catheter size: 20 Guage ?Catheter at skin depth: 8 cm ?Test dose: negative and Other ? ?Assessment ?Events: blood not aspirated, injection not painful, no injection resistance and negative IV test ? ?Additional Notes ?Informed consent obtained prior to proceeding including risk of failure, 1% risk of PDPH, risk of minor discomfort and bruising.  Discussed rare but serious complications including epidural abscess, permanent nerve injury, epidural hematoma.  Discussed alternatives to epidural analgesia and patient desires to proceed.  Timeout performed pre-procedure verifying patient name, procedure, and platelet count.  Patient tolerated procedure well. ? ? ? ? ?

## 2021-04-10 MED ORDER — IBUPROFEN 800 MG PO TABS: 800.0000 mg | ORAL_TABLET | Freq: Three times a day (TID) | ORAL | 0 refills | Status: DC

## 2021-04-10 NOTE — Progress Notes (Signed)
Patient refused interpreter. Patient verbalized understanding ?

## 2021-04-10 NOTE — Lactation Note (Signed)
This note was copied from a baby's chart. ?Lactation Consultation Note ? ?Patient Name: Stacey Graves ?Today's Date: 04/10/2021 ?Reason for consult: Follow-up assessment;Mother's request;Term;Breastfeeding assistance;Nipple pain/trauma ?Age:40 hours ? ?Stacey Graves assisted with getting a deeper latch changing position from cradle to football. Nipples intact just a compression stripe. Mom use EBM for nipple care. With changes made as noted above, pinching with the latch resolved.  ?Mom feeding plan is to EBF. LC went over breast compression in midst of the feed to offer more volume.  ? ?Infant adequate urine and stool output with 3 urine and 2 stool today prior to discharge.  ?All questions answered at the end of the visit.  ? ?Maternal Data ?Has patient been taught Hand Expression?: Yes ? ?Feeding ?Mother's Current Feeding Choice: Breast Milk ? ?LATCH Score ?Latch: Grasps breast easily, tongue down, lips flanged, rhythmical sucking. ? ?Audible Swallowing: Spontaneous and intermittent ? ?Type of Nipple: Everted at rest and after stimulation ? ?Comfort (Breast/Nipple): Filling, red/small blisters or bruises, mild/mod discomfort ? ?Hold (Positioning): Assistance needed to correctly position infant at breast and maintain latch. ? ?LATCH Score: 8 ? ? ?Lactation Tools Discussed/Used ?  ? ?Interventions ?Interventions: Breast feeding basics reviewed;Assisted with latch;Skin to skin;Breast massage;Hand express;Breast compression;Adjust position;Support pillows;Expressed milk;Position options;Education;LC Magazine features editor;Infant Driven Feeding Algorithm education ? ?Discharge ?Discharge Education: Engorgement and breast care;Warning signs for feeding baby ?[place] Program: Yes ? ?Consult Status ?Consult Status: Complete ?Date: 04/10/21 ? ? ? ?Stacey Svec  Graves ?04/10/2021, 11:22 AM ? ? ? ?

## 2021-04-10 NOTE — Progress Notes (Signed)
Patient is doing well.  She is ambulating, voiding, tolerating PO.  Pain control is good.  Lochia is appropriate ? ?Vitals:  ? 04/09/21 1402 04/09/21 1751 04/09/21 2115 04/10/21 0424  ?BP: 110/78 101/67 90/60 101/66  ?Pulse: (!) 57 (!) 58 61 60  ?Resp: 18 18 18 18   ?Temp: 97.6 ?F (36.4 ?C) 97.6 ?F (36.4 ?C) 98.2 ?F (36.8 ?C) 98 ?F (36.7 ?C)  ?TempSrc: Oral Oral Oral Oral  ?SpO2: 100% 100% 100% 100%  ?Weight:      ?Height:      ? ? ?NAD ?Fundus firm ?Ext: no edema ? ?Lab Results  ?Component Value Date  ? WBC 11.2 (H) 04/09/2021  ? HGB 10.8 (L) 04/09/2021  ? HCT 32.3 (L) 04/09/2021  ? MCV 93.4 04/09/2021  ? PLT 211 04/09/2021  ? ? ?--/--/A NEG (03/03 1132)/RImmune ? ?A/P 41 y.o. 24 PPD#1. ?Routine care.   ?GDMA1-- fasting blood glucose was not checked today.  Patient will check a few at home ?Rh negative--rhogam not indicated--baby Rh neg ?Meeting all goals.  Discharge to home today. ? ? ?Mateen Franssen GEFFEL Audie Stayer ? ?

## 2021-04-19 ENCOUNTER — Telehealth (HOSPITAL_COMMUNITY): Payer: Self-pay | Admitting: *Deleted

## 2021-04-19 NOTE — Telephone Encounter (Signed)
Patient voiced no questions or concerns at this time. EPDS=0. Patient voiced no questions or concerns regarding infant at this time. Patient reports infant sleeps in a crib on her back. RN reviewed ABCs of safe sleep. Patient verbalized understanding. Patient requested RN email information on hospital's virtual postpartum classes and support groups. Email sent. Deforest Hoyles, RN, 04/19/21, [phone] ?

## 2022-10-08 ENCOUNTER — Ambulatory Visit (HOSPITAL_COMMUNITY)
Admission: EM | Admit: 2022-10-08 | Discharge: 2022-10-08 | Disposition: A | Discharge status: Home / Self Care | Payer: Medicaid Other | Attending: Emergency Medicine | Admitting: Emergency Medicine

## 2022-10-08 ENCOUNTER — Encounter (HOSPITAL_COMMUNITY): Payer: Self-pay | Admitting: Emergency Medicine

## 2022-10-08 DIAGNOSIS — Z1152 Encounter for screening for COVID-19: Secondary | ICD-10-CM | POA: Diagnosis not present

## 2022-10-08 DIAGNOSIS — J4521 Mild intermittent asthma with (acute) exacerbation: Secondary | ICD-10-CM | POA: Insufficient documentation

## 2022-10-08 DIAGNOSIS — J069 Acute upper respiratory infection, unspecified: Secondary | ICD-10-CM | POA: Diagnosis present

## 2022-10-08 MED ORDER — VENTOLIN HFA 108 (90 BASE) MCG/ACT IN AERS: 1.0000 | INHALATION_SPRAY | RESPIRATORY_TRACT | 0 refills | Status: AC | PRN

## 2022-10-08 NOTE — Discharge Instructions (Addendum)
I have sent in an albuterol inhaler, you can use this every 4 hours as needed for wheezing or shortness of breath.  For your sore throat you can do warm saline gargles, tea with honey and sleep with a humidifier.  For any aches and pains you can alternate between and her milligrams of ibuprofen and 500 mg of Tylenol every 4-6 hours.  We have swabbed you for COVID-19 today and our staff will notify you if the results are positive.  Please return to clinic if you do not have improvement over the next week, or you develop any new or concerning symptoms.

## 2022-10-08 NOTE — ED Triage Notes (Signed)
Pt reports has asthma and had some coughing and wheezing that started last night. Denies having an inhaler.

## 2022-10-08 NOTE — ED Provider Notes (Signed)
[facility]    CSN: [id number] Arrival date & time: 10/08/22  1437      History   Chief Complaint Chief Complaint  Patient presents with   Cough   Wheezing    HPI Stacey Graves is a 42 y.o. female.   Patient presents to clinic for complaints of a nonproductive cough, wheezing and sore throat that started last night.  She has not tried any medications for her symptoms.  Denies recent sick contacts.  She does have a history of asthma, has not had a flareup in years.  She denies nasal congestion, fevers, abdominal pain, nausea, vomiting, diarrhea or chest pain.    The history is provided by the patient and medical records.  Cough Associated symptoms: sore throat and wheezing   Associated symptoms: no chest pain, no chills, no fever and no shortness of breath   Wheezing Associated symptoms: cough and sore throat   Associated symptoms: no chest pain, no fever and no shortness of breath     Past Medical History:  Diagnosis Date   Asthma    Medical history non-contributory    No pertinent past medical history    Preterm delivery     Patient Active Problem List   Diagnosis Date Noted   Gestational diabetes mellitus (GDM), antepartum 11/26/2020   AMA (advanced maternal age) multigravida 35+ 01/29/2019   IUGR (intrauterine growth restriction) affecting care of mother, unspecified trimester, fetus 2 04/19/2017   Postpartum state 04/19/2017   Spontaneous vaginal delivery 12/30/2014   Term pregnancy 12/29/2014   VBAC (vaginal birth after Cesarean) 02/21/2013   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 constitutional vs IUGR 01/05/2013   Previous cesarean delivery, antepartum condition or complication--desires VBAC 01/05/2013   GBS (group B Streptococcus carrier), +RV culture, currently pregnant 01/05/2013   Rh  negative state in antepartum period 01/05/2013   Marginal insertion of umbilical cord 01/05/2013    Past Surgical History:  Procedure Laterality Date   CESAREAN SECTION  02/08/2011   Procedure: CESAREAN SECTION;  Surgeon: Caprice Beaver;  Location: [facility];  Service: Gynecology;  Laterality: N/A;    OB History     Gravida  6   Para  6   Term  5   Preterm  1   AB      Living  6      SAB      IAB      Ectopic      Multiple  0   Live Births  6            Home Medications    Prior to Admission medications   Medication Sig Start Date End Date Taking? Authorizing Provider  albuterol (VENTOLIN HFA) 108 (90 Base) MCG/ACT inhaler Inhale 1-2 puffs into the lungs every 4 (four) hours as needed for wheezing or shortness of breath. 10/08/22  Yes Rinaldo Ratel, [place], FNP  ibuprofen (ADVIL) 800 MG tablet Take 1 tablet (800 mg total) by mouth 3 (three) times daily. 04/10/21   Marlow Baars, MD  Prenatal Vit-Fe Fumarate-FA (PRENATAL MULTIVITAMIN) TABS tablet Take 1 tablet by mouth daily. 05/04/14   Elson Areas, PA-C    Family History Family History  Problem Relation Age of Onset   Hypertension Mother    Asthma Father     Social History Social History   Tobacco  Use   Smoking status: Never   Smokeless tobacco: Never  Vaping Use   Vaping status: Never Used  Substance Use Topics   Alcohol use: No   Drug use: No     Allergies   Patient has no known allergies.   Review of Systems Review of Systems  Constitutional:  Negative for chills and fever.  HENT:  Positive for sore throat.   Respiratory:  Positive for cough and wheezing. Negative for shortness of breath.   Cardiovascular:  Negative for chest pain.  Gastrointestinal:  Negative for abdominal pain, diarrhea, nausea and vomiting.     Physical Exam Triage Vital Signs ED Triage Vitals  Encounter Vitals Group     BP 10/08/22 1549 122/84     Systolic BP Percentile --      Diastolic BP Percentile --       Pulse Rate 10/08/22 1549 82     Resp 10/08/22 1549 16     Temp 10/08/22 1549 97.8 F (36.6 C)     Temp Source 10/08/22 1549 Oral     SpO2 10/08/22 1549 99 %     Weight --      Height --      Head Circumference --      Peak Flow --      Pain Score 10/08/22 1548 0     Pain Loc --      Pain Education --      Exclude from Growth Chart --    No data found.  Updated Vital Signs BP 122/84 (BP Location: Right Arm)   Pulse 82   Temp 97.8 F (36.6 C) (Oral)   Resp 16   LMP 09/21/2022   SpO2 99%   Visual Acuity Right Eye Distance:   Left Eye Distance:   Bilateral Distance:    Right Eye Near:   Left Eye Near:    Bilateral Near:     Physical Exam Vitals and nursing note reviewed.  Constitutional:      Appearance: Normal appearance.  HENT:     Head: Normocephalic and atraumatic.     Right Ear: External ear normal.     Left Ear: External ear normal.     Nose: Nose normal.     Mouth/Throat:     Mouth: Mucous membranes are moist.  Eyes:     Conjunctiva/sclera: Conjunctivae normal.  Cardiovascular:     Rate and Rhythm: Normal rate and regular rhythm.     Heart sounds: Normal heart sounds. No murmur heard. Pulmonary:     Effort: Pulmonary effort is normal. No respiratory distress.     Breath sounds: Normal breath sounds.  Musculoskeletal:        General: Normal range of motion.  Skin:    General: Skin is warm and dry.  Neurological:     General: No focal deficit present.     Mental Status: She is alert and oriented to person, place, and time.  Psychiatric:        Mood and Affect: Mood normal.        Behavior: Behavior normal. Behavior is cooperative.      UC Treatments / Results  Labs (all labs ordered are listed, but only abnormal results are displayed) Labs Reviewed  SARS CORONAVIRUS 2 (TAT 6-24 HRS)    EKG   Radiology No results found.  Procedures Procedures (including critical care time)  Medications Ordered in UC Medications - No data to  display  Initial Impression / Assessment and Plan /  UC Course  I have reviewed the triage vital signs and the nursing notes.  Pertinent labs & imaging results that were available during my care of the patient were reviewed by me and considered in my medical decision making (see chart for details).  Vitals and triage reviewed, patient is hemodynamically stable.  Lungs are vesicular posteriorly, heart with regular rate and rhythm.  Posterior pharynx with mild erythema.  Suspect a mild asthma exacerbation due to viral illness.  COVID-19 testing obtained.  Symptomatic management discussed, will refill albuterol inhaler.  Plan of care, follow-up care return precautions given, no questions at this time.     Final Clinical Impressions(s) / UC Diagnoses   Final diagnoses:  Mild intermittent asthma with acute exacerbation  Viral URI with cough     Discharge Instructions      I have sent in an albuterol inhaler, you can use this every 4 hours as needed for wheezing or shortness of breath.  For your sore throat you can do warm saline gargles, tea with honey and sleep with a humidifier.  For any aches and pains you can alternate between and her milligrams of ibuprofen and 500 mg of Tylenol every 4-6 hours.  We have swabbed you for COVID-19 today and our staff will notify you if the results are positive.  Please return to clinic if you do not have improvement over the next week, or you develop any new or concerning symptoms.      ED Prescriptions     Medication Sig Dispense Auth. Provider   albuterol (VENTOLIN HFA) 108 (90 Base) MCG/ACT inhaler Inhale 1-2 puffs into the lungs every 4 (four) hours as needed for wheezing or shortness of breath. 54 g Azari Hasler, [place], [place]      PDMP not reviewed this encounter.   Tavonte Seybold, [place] N, [place] 10/08/22 [phone]

## 2022-10-09 LAB — SARS CORONAVIRUS 2 (TAT 6-24 HRS): SARS Coronavirus 2: NEGATIVE

## 2023-01-29 ENCOUNTER — Encounter (HOSPITAL_COMMUNITY): Payer: Self-pay

## 2023-01-29 ENCOUNTER — Other Ambulatory Visit: Payer: Self-pay

## 2023-01-29 ENCOUNTER — Emergency Department (HOSPITAL_COMMUNITY)
Admission: EM | Admit: 2023-01-29 | Discharge: 2023-01-29 | Disposition: A | Discharge status: Home / Self Care | Payer: Medicaid Other | Attending: Emergency Medicine | Admitting: Emergency Medicine

## 2023-01-29 ENCOUNTER — Emergency Department (HOSPITAL_COMMUNITY): Payer: Medicaid Other

## 2023-01-29 DIAGNOSIS — Z1152 Encounter for screening for COVID-19: Secondary | ICD-10-CM | POA: Insufficient documentation

## 2023-01-29 DIAGNOSIS — R062 Wheezing: Secondary | ICD-10-CM | POA: Diagnosis present

## 2023-01-29 DIAGNOSIS — J4521 Mild intermittent asthma with (acute) exacerbation: Secondary | ICD-10-CM | POA: Diagnosis not present

## 2023-01-29 LAB — CBC WITH DIFFERENTIAL/PLATELET
Abs Immature Granulocytes: 0.05 10*3/uL (ref 0.00–0.07)
Basophils Absolute: 0 10*3/uL (ref 0.0–0.1)
Basophils Relative: 0 %
Eosinophils Absolute: 0.3 10*3/uL (ref 0.0–0.5)
Eosinophils Relative: 2 %
HCT: 40.4 % (ref 36.0–46.0)
Hemoglobin: 13.3 g/dL (ref 12.0–15.0)
Immature Granulocytes: 1 %
Lymphocytes Relative: 8 %
Lymphs Abs: 0.9 10*3/uL (ref 0.7–4.0)
MCH: 29.8 pg (ref 26.0–34.0)
MCHC: 32.9 g/dL (ref 30.0–36.0)
MCV: 90.6 fL (ref 80.0–100.0)
Monocytes Absolute: 0.5 10*3/uL (ref 0.1–1.0)
Monocytes Relative: 4 %
Neutro Abs: 9.1 10*3/uL — ABNORMAL HIGH (ref 1.7–7.7)
Neutrophils Relative %: 85 %
Platelets: 390 10*3/uL (ref 150–400)
RBC: 4.46 MIL/uL (ref 3.87–5.11)
RDW: 13.5 % (ref 11.5–15.5)
WBC: 10.8 10*3/uL — ABNORMAL HIGH (ref 4.0–10.5)
nRBC: 0 % (ref 0.0–0.2)

## 2023-01-29 LAB — BASIC METABOLIC PANEL
Anion gap: 10 (ref 5–15)
BUN: 10 mg/dL (ref 6–20)
CO2: 21 mmol/L — ABNORMAL LOW (ref 22–32)
Calcium: 9.1 mg/dL (ref 8.9–10.3)
Chloride: 106 mmol/L (ref 98–111)
Creatinine, Ser: 0.63 mg/dL (ref 0.44–1.00)
GFR, Estimated: >60 mL/min (ref >60)
Glucose, Bld: 122 mg/dL — ABNORMAL HIGH (ref 70–99)
Potassium: 4.2 mmol/L (ref 3.5–5.1)
Sodium: 137 mmol/L (ref 135–145)

## 2023-01-29 LAB — RESP PANEL BY RT-PCR (RSV, FLU A&B, COVID)  RVPGX2
Influenza A by PCR: NEGATIVE
Influenza B by PCR: NEGATIVE
Resp Syncytial Virus by PCR: NEGATIVE
SARS Coronavirus 2 by RT PCR: NEGATIVE

## 2023-01-29 LAB — HCG, SERUM, QUALITATIVE: Preg, Serum: NEGATIVE

## 2023-01-29 MED ORDER — METHYLPREDNISOLONE SODIUM SUCC 125 MG IJ SOLR
125.0000 mg | Freq: Once | INTRAMUSCULAR | Status: DC
Start: 2023-01-29 — End: 2023-01-29

## 2023-01-29 MED ORDER — ALBUTEROL SULFATE HFA 108 (90 BASE) MCG/ACT IN AERS
2.0000 | INHALATION_SPRAY | Freq: Once | RESPIRATORY_TRACT | Status: AC
  Administered 2023-01-29: 2 via RESPIRATORY_TRACT
  Filled 2023-01-29: qty 6.7

## 2023-01-29 MED ORDER — PREDNISONE 20 MG PO TABS
60.0000 mg | ORAL_TABLET | Freq: Once | ORAL | Status: AC
  Administered 2023-01-29: 60 mg via ORAL
  Filled 2023-01-29: qty 3

## 2023-01-29 MED ORDER — METHYLPREDNISOLONE 4 MG PO TBPK: ORAL_TABLET | ORAL | 0 refills | Status: DC

## 2023-01-29 MED ORDER — IPRATROPIUM-ALBUTEROL 0.5-2.5 (3) MG/3ML IN SOLN
3.0000 mL | Freq: Once | RESPIRATORY_TRACT | Status: AC
  Administered 2023-01-29: 3 mL via RESPIRATORY_TRACT

## 2023-01-29 MED ORDER — IPRATROPIUM-ALBUTEROL 0.5-2.5 (3) MG/3ML IN SOLN
RESPIRATORY_TRACT | Status: AC
  Filled 2023-01-29: qty 3

## 2023-01-29 MED ORDER — MAGNESIUM SULFATE 2 GM/50ML IV SOLN
2.0000 g | Freq: Once | INTRAVENOUS | Status: AC
  Administered 2023-01-29: 2 g via INTRAVENOUS
  Filled 2023-01-29: qty 50

## 2023-01-29 MED ORDER — AZITHROMYCIN 250 MG PO TABS: 250.0000 mg | ORAL_TABLET | Freq: Every day | ORAL | 0 refills | Status: DC

## 2023-01-29 MED ORDER — IPRATROPIUM BROMIDE 0.02 % IN SOLN
0.5000 mg | Freq: Once | RESPIRATORY_TRACT | Status: AC
  Administered 2023-01-29: 0.5 mg via RESPIRATORY_TRACT
  Filled 2023-01-29: qty 2.5

## 2023-01-29 MED ORDER — SODIUM CHLORIDE 0.9 % IV BOLUS
1000.0000 mL | Freq: Once | INTRAVENOUS | Status: AC
  Administered 2023-01-29: 1000 mL via INTRAVENOUS

## 2023-01-29 MED ORDER — ALBUTEROL SULFATE (2.5 MG/3ML) 0.083% IN NEBU
5.0000 mg | INHALATION_SOLUTION | Freq: Once | RESPIRATORY_TRACT | Status: AC
Start: 2023-01-29 — End: 2023-01-29
  Administered 2023-01-29: 5 mg via RESPIRATORY_TRACT
  Filled 2023-01-29: qty 6

## 2023-01-29 NOTE — ED Provider Triage Note (Signed)
Emergency Medicine Provider Triage Evaluation Note  Stacey Graves , a 42 y.o. female  was evaluated in triage.  Pt complains of asthma exacerbation  Review of Systems  Positive: Short of breath Negative: fever  Physical Exam  BP (!) 162/87   Pulse (!) 126   Temp 99.3 F (37.4 C) (Oral)   Resp (!) 24   Ht 5\' 3"  (1.6 m)   Wt 55.3 kg   LMP 01/24/2023 (Approximate)   SpO2 93%   Breastfeeding Yes   BMI 21.61 kg/m  Gen:   Awake, no distress   Resp:  Normal effort  MSK:   Moves extremities without difficulty  Other:    Medical Decision Making  Medically screening exam initiated at 6:15 PM.  Appropriate orders placed.  Stacey Graves was informed that the remainder of the evaluation will be completed by another provider, this initial triage assessment does not replace that evaluation, and the importance of remaining in the ED until their evaluation is complete.     Stacey Graves, [place] 01/29/23 1816

## 2023-01-29 NOTE — ED Provider Notes (Signed)
[place] EMERGENCY DEPARTMENT AT [facility] Provider Note   CSN: [id number] Arrival date & time: 01/29/23  1748     History  Chief Complaint  Patient presents with   Wheezing    Stacey Graves is a 42 y.o. female history of asthma here presenting with shortness of breath.  Patient states that she had some nonproductive cough for several days.  She states that she usually gets asthma exacerbation when that happens.  Denies any fevers or chills.  Patient was given prednisone and 1 DuoNeb in triage.  Patient states that she had no recent hospitalizations for asthma.  The history is provided by the patient.       Home Medications Prior to Admission medications   Medication Sig Start Date End Date Taking? Authorizing Provider  albuterol (VENTOLIN HFA) 108 (90 Base) MCG/ACT inhaler Inhale 1-2 puffs into the lungs every 4 (four) hours as needed for wheezing or shortness of breath. 10/08/22   Garrison, [place] N, FNP  ibuprofen (ADVIL) 800 MG tablet Take 1 tablet (800 mg total) by mouth 3 (three) times daily. 04/10/21   Marlow Baars, MD  Prenatal Vit-Fe Fumarate-FA (PRENATAL MULTIVITAMIN) TABS tablet Take 1 tablet by mouth daily. 05/04/14   Elson Areas, PA-C      Allergies    Patient has no known allergies.    Review of Systems   Review of Systems  Respiratory:  Positive for wheezing.   All other systems reviewed and are negative.   Physical Exam Updated Vital Signs BP (!) 162/87   Pulse (!) 126   Temp 99.3 F (37.4 C) (Oral)   Resp (!) 24   Ht 5\' 3"  (1.6 m)   Wt 55.3 kg   LMP 01/24/2023 (Approximate)   SpO2 93%   Breastfeeding Yes   BMI 21.61 kg/m  Physical Exam Vitals and nursing note reviewed.  Constitutional:      Comments: Slightly tachypneic  HENT:     Head: Normocephalic.     Nose: Nose normal.     Mouth/Throat:     Mouth: Mucous membranes are moist.  Eyes:     Extraocular Movements: Extraocular movements intact.     Pupils: Pupils are equal,  round, and reactive to light.  Cardiovascular:     Rate and Rhythm: Normal rate and regular rhythm.     Pulses: Normal pulses.  Pulmonary:     Comments: Tachypneic and mild diffuse wheezing.  No retractions Abdominal:     General: Abdomen is flat.     Palpations: Abdomen is soft.  Musculoskeletal:        General: Normal range of motion.     Cervical back: Normal range of motion and neck supple.  Skin:    General: Skin is warm.     Capillary Refill: Capillary refill takes less than 2 seconds.  Neurological:     General: No focal deficit present.  Psychiatric:        Mood and Affect: Mood normal.     ED Results / Procedures / Treatments   Labs (all labs ordered are listed, but only abnormal results are displayed) Labs Reviewed  RESP PANEL BY RT-PCR (RSV, FLU A&B, COVID)  [id number]  CBC WITH DIFFERENTIAL/PLATELET  BASIC METABOLIC PANEL  HCG, SERUM, QUALITATIVE    EKG None  Radiology No results found.  Procedures Procedures    Medications Ordered in ED Medications  sodium chloride 0.9 % bolus 1,000 mL (has no administration in time range)  albuterol (PROVENTIL) (  2.5 MG/3ML) 0.083% nebulizer solution 5 mg (has no administration in time range)  ipratropium (ATROVENT) nebulizer solution 0.5 mg (has no administration in time range)  magnesium sulfate IVPB 2 g 50 mL (has no administration in time range)  ipratropium-albuterol (DUONEB) 0.5-2.5 (3) MG/3ML nebulizer solution 3 mL (3 mLs Nebulization Given 01/29/23 1814)  predniSONE (DELTASONE) tablet 60 mg (60 mg Oral Given 01/29/23 1828)    ED Course/ Medical Decision Making/ A&P                                 Medical Decision Making Stacey Graves is a 42 y.o. female here presenting with cough and wheezing.  Patient has history of asthma.  Patient is tachycardic on arrival.  I think likely asthma exacerbation.  I have low suspicion for PE.  Plan to get CBC and BMP and chest x-ray and COVID and flu and RSV test.  Will give  steroids and DuoNeb and reassess  9:57 PM I reviewed patient's labs and they were unremarkable.  Chest x-ray showed no pneumonia.  Patient is feeling much better now.  Tachycardia has improved.  Will discharge home with albuterol as needed and course of steroid.  Patient request Z-Pak.  Problems Addressed: Mild intermittent asthma with acute exacerbation: acute illness or injury  Amount and/or Complexity of Data Reviewed Labs: ordered. Decision-making details documented in ED Course. Radiology: ordered and independent interpretation performed. Decision-making details documented in ED Course.  Risk Prescription drug management.    Final Clinical Impression(s) / ED Diagnoses Final diagnoses:  None    Rx / DC Orders ED Discharge Orders     None         Charlynne Pander, MD 01/29/23 2159

## 2023-01-29 NOTE — ED Triage Notes (Signed)
Pt reports she is here for an asthma attack that started yesterday. Respirators labored with audible wheezing.

## 2023-01-29 NOTE — Discharge Instructions (Addendum)
You have an asthma attack  I have prescribed Medrol Dosepak  You can use albuterol 2 puffs every 4 hours as needed  Take Z-Pak as prescribed  See your doctor for follow-up  Return to ER if you have worse shortness of breath or trouble breathing

## 2023-04-26 ENCOUNTER — Other Ambulatory Visit: Payer: Self-pay | Admitting: Physician Assistant

## 2023-04-26 DIAGNOSIS — Z1231 Encounter for screening mammogram for malignant neoplasm of breast: Secondary | ICD-10-CM

## 2023-06-01 ENCOUNTER — Inpatient Hospital Stay (HOSPITAL_COMMUNITY)
Admission: AD | Admit: 2023-06-01 | Discharge: 2023-06-02 | Disposition: A | Discharge status: Home / Self Care | Attending: Obstetrics and Gynecology | Admitting: Obstetrics and Gynecology

## 2023-06-01 ENCOUNTER — Encounter (HOSPITAL_COMMUNITY): Payer: Self-pay | Admitting: *Deleted

## 2023-06-01 ENCOUNTER — Inpatient Hospital Stay (HOSPITAL_COMMUNITY)
Admission: AD | Admit: 2023-06-01 | Discharge: 2023-06-01 | Disposition: A | Discharge status: Home / Self Care | Attending: Obstetrics and Gynecology | Admitting: Obstetrics and Gynecology

## 2023-06-01 ENCOUNTER — Inpatient Hospital Stay (HOSPITAL_COMMUNITY)

## 2023-06-01 DIAGNOSIS — O26891 Other specified pregnancy related conditions, first trimester: Secondary | ICD-10-CM | POA: Diagnosis not present

## 2023-06-01 DIAGNOSIS — O200 Threatened abortion: Secondary | ICD-10-CM | POA: Insufficient documentation

## 2023-06-01 DIAGNOSIS — R109 Unspecified abdominal pain: Secondary | ICD-10-CM | POA: Diagnosis not present

## 2023-06-01 DIAGNOSIS — Z3A01 Less than 8 weeks gestation of pregnancy: Secondary | ICD-10-CM | POA: Insufficient documentation

## 2023-06-01 DIAGNOSIS — O26851 Spotting complicating pregnancy, first trimester: Secondary | ICD-10-CM | POA: Insufficient documentation

## 2023-06-01 DIAGNOSIS — O209 Hemorrhage in early pregnancy, unspecified: Secondary | ICD-10-CM | POA: Diagnosis not present

## 2023-06-01 DIAGNOSIS — O09521 Supervision of elderly multigravida, first trimester: Secondary | ICD-10-CM | POA: Diagnosis not present

## 2023-06-01 DIAGNOSIS — O26899 Other specified pregnancy related conditions, unspecified trimester: Secondary | ICD-10-CM

## 2023-06-01 HISTORY — DX: Type 2 diabetes mellitus without complications: E11.9

## 2023-06-01 LAB — URINALYSIS, ROUTINE W REFLEX MICROSCOPIC
Bacteria, UA: NONE SEEN
Bilirubin Urine: NEGATIVE
Glucose, UA: NEGATIVE mg/dL
Ketones, ur: NEGATIVE mg/dL
Leukocytes,Ua: NEGATIVE
Nitrite: NEGATIVE
Protein, ur: NEGATIVE mg/dL
Specific Gravity, Urine: 1.005 (ref 1.005–1.030)
pH: 7 (ref 5.0–8.0)

## 2023-06-01 LAB — WET PREP, GENITAL
Clue Cells Wet Prep HPF POC: NONE SEEN
Sperm: NONE SEEN
Trich, Wet Prep: NONE SEEN
WBC, Wet Prep HPF POC: <10 (ref <10)
Yeast Wet Prep HPF POC: NONE SEEN

## 2023-06-01 LAB — CBC
HCT: 38.3 % (ref 36.0–46.0)
Hemoglobin: 12.7 g/dL (ref 12.0–15.0)
MCH: 29.3 pg (ref 26.0–34.0)
MCHC: 33.2 g/dL (ref 30.0–36.0)
MCV: 88.5 fL (ref 80.0–100.0)
Platelets: 389 10*3/uL (ref 150–400)
RBC: 4.33 MIL/uL (ref 3.87–5.11)
RDW: 13.2 % (ref 11.5–15.5)
WBC: 6 10*3/uL (ref 4.0–10.5)
nRBC: 0 % (ref 0.0–0.2)

## 2023-06-01 LAB — HCG, QUANTITATIVE, PREGNANCY: hCG, Beta Chain, Quant, S: 8801 m[IU]/mL — ABNORMAL HIGH (ref <5)

## 2023-06-01 NOTE — MAU Note (Signed)
 Stacey Graves is a 43 y.o. at Unknown here in MAU reporting: since last night had some brown d/c, changed to red today.  Scant.  No pain. Denies recent intercourse.  +HPT, confirmed at [facility] last Thursday.  LMP: 3/2 Onset of complaint: last night Pain score: none Vitals:   06/01/23 1341  BP: 119/63  Pulse: 77  Resp: 16  Temp: 98.3 F (36.8 C)  SpO2: 100%      Lab orders placed from triage:  urine/vag swabs

## 2023-06-01 NOTE — MAU Provider Note (Signed)
 History     CSN: [id number]  Arrival date and time: 06/01/23 1255   None     Chief Complaint  Patient presents with   Vaginal Bleeding   HPI Patient presenting for evaluation for vaginal bleeding in early pregnancy.  Reports the bleeding started last night and it was more of a brownish discharge.  Reports that she had a positive home pregnancy test and went to her OB provider at [facility] last Thursday with the confirmatory urine pregnancy test.  Denies any recent intercourse.  Denies any passage of clots.    OB History     Gravida  7   Para  6   Term  5   Preterm  1   AB      Living  6      SAB      IAB      Ectopic      Multiple  0   Live Births  6           Past Medical History:  Diagnosis Date   Asthma    Diabetes mellitus without complication (HCC)    A1c was elevated- was given diet   No pertinent past medical history    Preterm delivery     Past Surgical History:  Procedure Laterality Date   CESAREAN SECTION  02/08/2011   Procedure: CESAREAN SECTION;  Surgeon: Raeford Bullion;  Location: [facility];  Service: Gynecology;  Laterality: N/A;    Family History  Problem Relation Age of Onset   Hypertension Mother    Asthma Father     Social History   Tobacco Use   Smoking status: Never   Smokeless tobacco: Never  Vaping Use   Vaping status: Never Used  Substance Use Topics   Alcohol use: No   Drug use: No    Allergies: No Known Allergies  Medications Prior to Admission  Medication Sig Dispense Refill Last Dose/Taking   budesonide-formoterol (SYMBICORT) 160-4.5 MCG/ACT inhaler Inhale 2 puffs into the lungs 2 (two) times daily.   Past Month   Prenatal Vit-Fe Fumarate-FA (PRENATAL MULTIVITAMIN) TABS tablet Take 1 tablet by mouth daily. 60 tablet 1 05/31/2023 Evening   albuterol  (VENTOLIN  HFA) 108 (90 Base) MCG/ACT inhaler Inhale 1-2 puffs into the lungs every 4 (four) hours as needed for wheezing or shortness of breath. 54 g 0 More than  a month   azithromycin  (ZITHROMAX ) 250 MG tablet Take 1 tablet (250 mg total) by mouth daily. Take first 2 tablets together, then 1 every day until finished. 6 tablet 0    ibuprofen  (ADVIL ) 800 MG tablet Take 1 tablet (800 mg total) by mouth 3 (three) times daily. 30 tablet 0    methylPREDNISolone  (MEDROL  DOSEPAK) 4 MG TBPK tablet Use as directed 21 tablet 0     Review of Systems  Gastrointestinal:  Negative for abdominal pain, nausea and vomiting.  Genitourinary:  Positive for vaginal bleeding and vaginal discharge. Negative for vaginal pain.  All other systems reviewed and are negative.  Physical Exam   Blood pressure 133/76, pulse 85, temperature 98.3 F (36.8 C), temperature source Oral, resp. rate 16, height 5\' 3"  (1.6 m), weight 53.4 kg, last menstrual period 04/08/2023, SpO2 100%, currently breastfeeding.  Physical Exam Vitals and nursing note reviewed.  Constitutional:      Appearance: Normal appearance.  HENT:     Head: Normocephalic and atraumatic.     Nose: No congestion or rhinorrhea.  Eyes:     Extraocular  Movements: Extraocular movements intact.  Cardiovascular:     Rate and Rhythm: Normal rate.  Pulmonary:     Effort: Pulmonary effort is normal.  Abdominal:     Palpations: Abdomen is soft.     Tenderness: There is no abdominal tenderness.  Musculoskeletal:        General: Normal range of motion.     Cervical back: Normal range of motion.  Skin:    General: Skin is warm.     Capillary Refill: Capillary refill takes less than 2 seconds.  Neurological:     General: No focal deficit present.     Mental Status: She is alert.     Cranial Nerves: No cranial nerve deficit.  Psychiatric:        Mood and Affect: Mood normal.        Behavior: Behavior normal.     MAU Course  Procedures  MDM CBC hCG quant Ultrasound RhoGAM eval  Assessment and Plan  Samarrah Bahar is a 42 yo [id number] @ [redacted]w[redacted]d presenting for vaginal bleeding in early pregnancy  Vaginal  bleeding Patient with brown vaginal discharge for the last 24 hours.  Denies any abdominal pain.  CBC within normal limits, hCG 8801.  Ultrasound with single IUP EDD 6 weeks 0 days.  No subchorionic hemorrhage.  RhoGAM eval completed and offered patient RhoGAM.  Discussed at length risks and benefits and patient ultimately decided to not receive RhoGAM today.  She will return if bleeding worsens for RhoGAM.  Discussed strict return precautions.  No further questions or concerns.  Patient discharged home.  Khianna Blazina V Yuvonne Lanahan 06/01/2023, 6:23 PM

## 2023-06-01 NOTE — Discharge Instructions (Signed)
 It was a pleasure taking care of you today.  I did not find a cause for your bleeding.  It may be that the bleeding is coming from your cervix.  No sign of infection like yeast or BV.  An ultrasound was done and showed that your baby is approximately [redacted] weeks gestation.  This could also be the start of a miscarriage.  If you start bleeding more heavily [place] you to return for further evaluation.  If you have any concerns, worsening pain also and should return.  I hope you have a wonderful night!

## 2023-06-02 DIAGNOSIS — O209 Hemorrhage in early pregnancy, unspecified: Secondary | ICD-10-CM

## 2023-06-02 DIAGNOSIS — O26891 Other specified pregnancy related conditions, first trimester: Secondary | ICD-10-CM

## 2023-06-02 DIAGNOSIS — Z3A01 Less than 8 weeks gestation of pregnancy: Secondary | ICD-10-CM

## 2023-06-02 DIAGNOSIS — R109 Unspecified abdominal pain: Secondary | ICD-10-CM

## 2023-06-02 MED ORDER — CYCLOBENZAPRINE HCL 10 MG PO TABS: 10.0000 mg | ORAL_TABLET | Freq: Two times a day (BID) | ORAL | 0 refills | Status: AC | PRN

## 2023-06-02 MED ORDER — HYDROXYZINE HCL 25 MG PO TABS: 25.0000 mg | ORAL_TABLET | Freq: Three times a day (TID) | ORAL | 0 refills | Status: AC | PRN

## 2023-06-02 NOTE — MAU Note (Signed)
 Pt was here today - D/C home at 630pm.  Says at home - VB heavier  In triage - pad - small amt light reddish/ pink. Cramping - is worse - 5/10- doesn't want anything for pain.

## 2023-06-02 NOTE — MAU Provider Note (Signed)
 S [name] is a 43 y.o. [id number] patient who presents to MAU today with complaint of continuing to have vaginal bleeding similar to a period. She is also concerned about continuous lower abdominal cramping but declines any medication for pain.   Patient returns after being seen yesterday evening at 1823 for similar complaints and had an ultrasound which showed an early IUP ~ [redacted] week GA with a FHR. Patient states she is anxious about  a possible miscarriage and when she sees bleeding she comes in for evaluation. She denies heavy Red Vaginal bleeding and reports that cramping is light and she has not taken any medication for it.   Review of Systems  All other systems reviewed and are negative.  Unless noted in HPI  O BP 110/75 (BP Location: Right Arm)   Pulse 74   Temp 98.1 F (36.7 C) (Oral)   Resp 14   LMP 04/08/2023  Physical Exam Vitals and nursing note reviewed.  Constitutional:      Appearance: Normal appearance.  HENT:     Head: Normocephalic.     Nose: Nose normal.     Mouth/Throat:     Mouth: Mucous membranes are moist.  Cardiovascular:     Rate and Rhythm: Normal rate and regular rhythm.  Pulmonary:     Effort: Pulmonary effort is normal.     Breath sounds: Normal breath sounds.  Abdominal:     Palpations: Abdomen is soft.  Musculoskeletal:        General: Normal range of motion.  Skin:    General: Skin is warm.  Neurological:     Mental Status: She is alert and oriented to person, place, and time.  Psychiatric:        Mood and Affect: Mood normal.        Behavior: Behavior normal.      Results for orders placed or performed during the hospital encounter of 06/01/23 (from the past 24 hours)  Wet prep, genital     Status: None   Collection Time: 06/01/23  2:08 PM  Result Value Ref Range   Yeast Wet Prep HPF POC NONE SEEN NONE SEEN   Trich, Wet Prep NONE SEEN NONE SEEN   Clue Cells Wet Prep HPF POC NONE SEEN NONE SEEN   WBC, Wet Prep HPF POC <10 <10    Sperm NONE SEEN   Urinalysis, Routine w reflex microscopic -Urine, Clean Catch     Status: Abnormal   Collection Time: 06/01/23  2:08 PM  Result Value Ref Range   Color, Urine STRAW (A) YELLOW   APPearance CLEAR CLEAR   Specific Gravity, Urine 1.005 1.005 - 1.030   pH 7.0 5.0 - 8.0   Glucose, UA NEGATIVE NEGATIVE mg/dL   Hgb urine dipstick MODERATE (A) NEGATIVE   Bilirubin Urine NEGATIVE NEGATIVE   Ketones, ur NEGATIVE NEGATIVE mg/dL   Protein, ur NEGATIVE NEGATIVE mg/dL   Nitrite NEGATIVE NEGATIVE   Leukocytes,Ua NEGATIVE NEGATIVE   RBC / HPF 0-5 0 - 5 RBC/hpf   WBC, UA 0-5 0 - 5 WBC/hpf   Bacteria, UA NONE SEEN NONE SEEN   Squamous Epithelial / HPF 0-5 0 - 5 /HPF  CBC     Status: None   Collection Time: 06/01/23  2:21 PM  Result Value Ref Range   WBC 6.0 4.0 - 10.5 K/uL   RBC 4.33 3.87 - 5.11 MIL/uL   Hemoglobin 12.7 12.0 - 15.0 g/dL   HCT 65.7 84.6 - 96.2 %  MCV 88.5 80.0 - 100.0 fL   MCH 29.3 26.0 - 34.0 pg   MCHC 33.2 30.0 - 36.0 g/dL   RDW 96.0 45.4 - 09.8 %   Platelets 389 150 - 400 K/uL   nRBC 0.0 0.0 - 0.2 %  hCG, quantitative, pregnancy     Status: Abnormal   Collection Time: 06/01/23  2:21 PM  Result Value Ref Range   hCG, Beta Chain, Quant, S 8,801 (H) <5 mIU/mL  Rh IG workup (includes ABO/Rh)     Status: None (Preliminary result)   Collection Time: 06/01/23  2:21 PM  Result Value Ref Range   Gestational Age(Wks) 7    ABO/RH(D) A NEG    Antibody Screen NEG    Unit Number [id number]    Blood Component Type RHIG    Unit division 00    Status of Unit QUARANTINED    Transfusion Status      OK TO TRANSFUSE Performed at [facility], [address], [place], [place] [address]       MDM  LOW  I have reviewed the patient chart and performed the physical exam .Medications ordered as stated below.  A/P as described below.  Counseling and education provided and patient agreeable  with plan as described below. Verbalized understanding.     ASSESSMENT Medical screening exam complete  1. Vaginal bleeding affecting early pregnancy  2. Cramping affecting pregnancy, antepartum  3. Threatened miscarriage in early pregnancy    PLAN Future Appointments  Date Time Provider Department Center  06/22/2023 10:00 AM [facility]    Discharge from MAU in stable condition  Bleeding precautions reviewed   See AVS for full description of educational information and instructions provided to the patient at time of discharge   Warning signs for worsening condition that would warrant emergency follow-up discussed Patient may return to MAU as needed   Cherlynn Cornfield, NP 06/02/2023 1:48 AM

## 2023-06-04 LAB — RH IG WORKUP (INCLUDES ABO/RH)
ABO/RH(D): A NEG
Antibody Screen: NEGATIVE
Gestational Age(Wks): 7
Unit division: 0

## 2023-06-22 ENCOUNTER — Ambulatory Visit

## 2023-11-07 ENCOUNTER — Emergency Department (HOSPITAL_COMMUNITY)
Admission: EM | Admit: 2023-11-07 | Discharge: 2023-11-07 | Disposition: A | Discharge status: Home / Self Care | Attending: Emergency Medicine | Admitting: Emergency Medicine

## 2023-11-07 ENCOUNTER — Other Ambulatory Visit: Payer: Self-pay

## 2023-11-07 ENCOUNTER — Emergency Department (HOSPITAL_COMMUNITY)

## 2023-11-07 ENCOUNTER — Ambulatory Visit (HOSPITAL_COMMUNITY)
Admission: EM | Admit: 2023-11-07 | Discharge: 2023-11-07 | Disposition: A | Discharge status: Home / Self Care | Attending: Emergency Medicine | Admitting: Emergency Medicine

## 2023-11-07 ENCOUNTER — Encounter (HOSPITAL_COMMUNITY): Payer: Self-pay

## 2023-11-07 DIAGNOSIS — R079 Chest pain, unspecified: Secondary | ICD-10-CM

## 2023-11-07 DIAGNOSIS — R0789 Other chest pain: Secondary | ICD-10-CM | POA: Insufficient documentation

## 2023-11-07 LAB — HCG, SERUM, QUALITATIVE: Preg, Serum: NEGATIVE

## 2023-11-07 LAB — BASIC METABOLIC PANEL WITH GFR
Anion gap: 8 (ref 5–15)
BUN: 12 mg/dL (ref 6–20)
CO2: 24 mmol/L (ref 22–32)
Calcium: 9.2 mg/dL (ref 8.9–10.3)
Chloride: 107 mmol/L (ref 98–111)
Creatinine, Ser: 0.64 mg/dL (ref 0.44–1.00)
GFR, Estimated: >60 mL/min (ref >60)
Glucose, Bld: 114 mg/dL — ABNORMAL HIGH (ref 70–99)
Potassium: 4.1 mmol/L (ref 3.5–5.1)
Sodium: 139 mmol/L (ref 135–145)

## 2023-11-07 LAB — CBC
HCT: 39.4 % (ref 36.0–46.0)
Hemoglobin: 12.9 g/dL (ref 12.0–15.0)
MCH: 30.6 pg (ref 26.0–34.0)
MCHC: 32.7 g/dL (ref 30.0–36.0)
MCV: 93.4 fL (ref 80.0–100.0)
Platelets: 368 K/uL (ref 150–400)
RBC: 4.22 MIL/uL (ref 3.87–5.11)
RDW: 13.4 % (ref 11.5–15.5)
WBC: 4.6 K/uL (ref 4.0–10.5)
nRBC: 0 % (ref 0.0–0.2)

## 2023-11-07 LAB — TROPONIN I (HIGH SENSITIVITY): Troponin I (High Sensitivity): <2 ng/L (ref <18)

## 2023-11-07 NOTE — ED Notes (Signed)
 Patient transported to X-ray

## 2023-11-07 NOTE — Discharge Instructions (Signed)
 Your x-ray looks okay.  The marker for heart damage was negative.  Lets treat this like it is a muscular pain.  Max dosing of over-the-counter medication as listed below.  As we discussed please return for sudden worsening pain if you cough up blood or if you pass out.  Take 4 over the counter ibuprofen  tablets 3 times a day or 2 over-the-counter naproxen tablets twice a day for pain. Also take tylenol  1000mg (2 extra strength) four times a day.

## 2023-11-07 NOTE — ED Provider Notes (Signed)
 [facility]    CSN: [id number] Arrival date & time: 11/07/23  0813      History   Chief Complaint Chief Complaint  Patient presents with   Chest Pain    HPI Stacey Graves is a 43 y.o. female.   Patient presents with left-sided chest pain that began yesterday.  Patient states that when the pain began around 10 AM yesterday she became short of breath and states that she had trouble breathing for about 3 minutes and her body felt very heavy.  Patient states that at that time the pain also radiated to her left upper back.    Patient states that the pain is little better today, but has been constant since this episode.  Patient denies continued shortness of breath or continued upper back pain.  Patient denies taking anything for the pain.  Patient states that she cannot think of anything that makes the pain better or worse.  Patient denies worsening pain with movement.  Past medical history includes asthma and diabetes.  Patient states that she has not had to use her albuterol  inhaler due to not having any wheezing.  Patient also denies any recent cough, congestion, or fever.  The history is provided by the patient and medical records.  Chest Pain   Past Medical History:  Diagnosis Date   Asthma    Diabetes mellitus without complication (HCC)    A1c was elevated- was given diet   No pertinent past medical history    Preterm delivery     Patient Active Problem List   Diagnosis Date Noted   Gestational diabetes mellitus (GDM), antepartum 11/26/2020   AMA (advanced maternal age) multigravida 35+ 01/29/2019   IUGR (intrauterine growth restriction) affecting care of mother, unspecified trimester, fetus 2 04/19/2017   Postpartum state 04/19/2017   Spontaneous vaginal delivery 12/30/2014   Term pregnancy 12/29/2014   VBAC (vaginal birth after Cesarean) 02/21/2013   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 constitutional vs IUGR 01/05/2013   Previous cesarean delivery, antepartum condition or complication--desires VBAC 01/05/2013   GBS (group B Streptococcus carrier), +RV culture, currently pregnant 01/05/2013   Rh negative state in antepartum period 01/05/2013   Marginal insertion of umbilical cord 01/05/2013    Past Surgical History:  Procedure Laterality Date   CESAREAN SECTION  02/08/2011   Procedure: CESAREAN SECTION;  Surgeon: Lamar CHRISTELLA Spence;  Location: [facility];  Service: Gynecology;  Laterality: N/A;    OB History     Gravida  7   Para  6   Term  5   Preterm  1   AB      Living  6      SAB      IAB      Ectopic      Multiple  0   Live Births  6            Home Medications    Prior to Admission medications   Medication Sig Start Date End Date Taking? Authorizing Provider  albuterol  (VENTOLIN  HFA) 108 (90 Base) MCG/ACT inhaler Inhale 1-2 puffs into the lungs every 4 (four) hours as needed for wheezing or shortness of breath. 10/08/22   Dreama, [place]  N, FNP  budesonide-formoterol (SYMBICORT) 160-4.5 MCG/ACT inhaler Inhale 2 puffs into the lungs 2 (two) times daily.    [provider]  cyclobenzaprine  (FLEXERIL ) 10 MG tablet Take 1 tablet (10 mg total) by mouth 2 (two) times daily as needed for muscle spasms. 06/02/23   Cooleen, Olam LABOR, NP  hydrOXYzine  (ATARAX ) 25 MG tablet Take 1 tablet (25 mg total) by mouth 3 (three) times daily as needed for anxiety. 06/02/23   Littie Olam LABOR, NP  Prenatal Vit-Fe Fumarate-FA (PRENATAL MULTIVITAMIN) TABS tablet Take 1 tablet by mouth daily. 05/04/14   Flint Sonny POUR, PA-C    Family History Family History  Problem Relation Age of Onset   Hypertension Mother    Asthma Father     Social History Social History   Tobacco Use   Smoking status: Never   Smokeless tobacco: Never  Vaping Use   Vaping status: Never Used   Substance Use Topics   Alcohol use: No   Drug use: No     Allergies   Patient has no known allergies.   Review of Systems Review of Systems  Cardiovascular:  Positive for chest pain.   Per HPI  Physical Exam Triage Vital Signs ED Triage Vitals  Encounter Vitals Group     BP 11/07/23 0858 107/71     Girls Systolic BP Percentile --      Girls Diastolic BP Percentile --      Boys Systolic BP Percentile --      Boys Diastolic BP Percentile --      Pulse Rate 11/07/23 0858 68     Resp 11/07/23 0858 16     Temp 11/07/23 0858 97.8 F (36.6 C)     Temp Source 11/07/23 0858 Oral     SpO2 11/07/23 0858 98 %     Weight --      Height --      Head Circumference --      Peak Flow --      Pain Score 11/07/23 0857 5     Pain Loc --      Pain Education --      Exclude from Growth Chart --    No data found.  Updated Vital Signs BP 107/71 (BP Location: Left Arm)   Pulse 68   Temp 97.8 F (36.6 C) (Oral)   Resp 16   LMP 11/01/2023   SpO2 98%   Breastfeeding No   Visual Acuity Right Eye Distance:   Left Eye Distance:   Bilateral Distance:    Right Eye Near:   Left Eye Near:    Bilateral Near:     Physical Exam Vitals and nursing note reviewed.  Constitutional:      General: She is awake. She is not in acute distress.    Appearance: Normal appearance. She is well-developed and well-groomed. She is not ill-appearing.  Cardiovascular:     Rate and Rhythm: Normal rate and regular rhythm.     Heart sounds: Normal heart sounds.  Pulmonary:     Effort: Pulmonary effort is normal.     Breath sounds: Normal breath sounds.  Chest:     Chest wall: No tenderness.  Abdominal:     General: Abdomen is flat. Bowel sounds are normal. There is no distension.     Palpations: Abdomen is soft.     Tenderness: There is no abdominal tenderness. There is no right CVA tenderness, left CVA tenderness, guarding or rebound.  Musculoskeletal:     Cervical back: Normal range of motion  and neck supple.  Skin:    General: Skin is warm and dry.  Neurological:  Mental Status: She is alert.  Psychiatric:        Behavior: Behavior is cooperative.      UC Treatments / Results  Labs (all labs ordered are listed, but only abnormal results are displayed) Labs Reviewed - No data to display  EKG   Radiology No results found.  Procedures Procedures (including critical care time)  Medications Ordered in UC Medications - No data to display  Initial Impression / Assessment and Plan / UC Course  I have reviewed the triage vital signs and the nursing notes.  Pertinent labs & imaging results that were available during my care of the patient were reviewed by me and considered in my medical decision making (see chart for details).     Patient is overall well-appearing.  Vitals are stable.  EKG reveals normal sinus rhythm.  Nontender upon palpation to chest.  Lung and heart sounds normal.  Recommended patient be seen in the emergency department for further evaluation of her chest pain.  Patient is understanding and agreeable to plan at this time.  Patient is stable to arrive to the ER via POV. Final Clinical Impressions(s) / UC Diagnoses   Final diagnoses:  Chest pain, unspecified type     Discharge Instructions      Please go to the emergency department for further evaluation of your chest pain.     ED Prescriptions   None    PDMP not reviewed this encounter.   Johnie Flaming A, NP 11/07/23 [phone]

## 2023-11-07 NOTE — ED Triage Notes (Signed)
 Pt arrives via POV. Pt reports she began experiencing left sided chest pain yesterday around 1000. States she did have some associated sob yesterday, denies sob at this time. Pt is AxOx4.

## 2023-11-07 NOTE — ED Provider Notes (Signed)
  EMERGENCY DEPARTMENT AT [facility] Provider Note   CSN: [id number] Arrival date & time: 11/07/23  9082     Patient presents with: Chest Pain   Stacey Graves is a 43 y.o. female.   43 yo F with a chief complaints of left-sided chest discomfort.  This has been going on for about 24 hours.  Started yesterday while she was at rest.  Upper part of her left chest.  Denied radiation had some difficulty breathing with it.  Feels like it was short-lived but since then she has had more of a dull sensation there and is also felt a bit more fatigued and feels like maybe it goes into her neck.  She denies injury.  Denies cough congestion or fever.  Patient denies history of MI, denies hypertension hyperlipidemia diabetes or smoking.  Denies family history of MI.  Patient denies history of PE or DVT denies hemoptysis denies unilateral lower extremity edema denies recent surgery immobilization hospitalization estrogen use or history of cancer.     Chest Pain      Prior to Admission medications   Medication Sig Start Date End Date Taking? Authorizing Provider  albuterol  (VENTOLIN  HFA) 108 (90 Base) MCG/ACT inhaler Inhale 1-2 puffs into the lungs every 4 (four) hours as needed for wheezing or shortness of breath. 10/08/22   Dreama, [place]  N, FNP  budesonide-formoterol (SYMBICORT) 160-4.5 MCG/ACT inhaler Inhale 2 puffs into the lungs 2 (two) times daily.    [provider]  cyclobenzaprine  (FLEXERIL ) 10 MG tablet Take 1 tablet (10 mg total) by mouth 2 (two) times daily as needed for muscle spasms. 06/02/23   Cooleen, Olam LABOR, NP  hydrOXYzine  (ATARAX ) 25 MG tablet Take 1 tablet (25 mg total) by mouth 3 (three) times daily as needed for anxiety. 06/02/23   Littie Olam LABOR, NP  Prenatal Vit-Fe Fumarate-FA (PRENATAL MULTIVITAMIN) TABS tablet Take 1 tablet by mouth daily. 05/04/14   Sofia, Leslie K, PA-C    Allergies: Patient has no known allergies.    Review of Systems   Cardiovascular:  Positive for chest pain.    Updated Vital Signs BP 127/77 (BP Location: Right Arm)   Pulse 63   Temp 98.1 F (36.7 C)   Resp 18   Ht 5' 3 (1.6 m)   Wt 54 kg   LMP 11/01/2023   SpO2 98%   BMI 21.08 kg/m   Physical Exam Vitals and nursing note reviewed.  Constitutional:      General: She is not in acute distress.    Appearance: She is well-developed. She is not diaphoretic.  HENT:     Head: Normocephalic and atraumatic.  Eyes:     Pupils: Pupils are equal, round, and reactive to light.  Cardiovascular:     Rate and Rhythm: Normal rate and regular rhythm.     Heart sounds: No murmur heard.    No friction rub. No gallop.  Pulmonary:     Effort: Pulmonary effort is normal.     Breath sounds: No wheezing or rales.  Chest:     Comments: No obvious discomfort with chest palpation.  She points to area just under the clavicle on the left. Abdominal:     General: There is no distension.     Palpations: Abdomen is soft.     Tenderness: There is no abdominal tenderness.  Musculoskeletal:        General: No tenderness.     Cervical back: Normal range of motion and neck  supple.  Skin:    General: Skin is warm and dry.  Neurological:     Mental Status: She is alert and oriented to person, place, and time.  Psychiatric:        Behavior: Behavior normal.     (all labs ordered are listed, but only abnormal results are displayed) Labs Reviewed  BASIC METABOLIC PANEL WITH GFR - Abnormal; Notable for the following components:      Result Value   Glucose, Bld 114 (*)    All other components within normal limits  CBC  HCG, SERUM, QUALITATIVE  TROPONIN I (HIGH SENSITIVITY)  TROPONIN I (HIGH SENSITIVITY)    EKG: None  Radiology: DG Chest 2 View Result Date: 11/07/2023 CLINICAL DATA:  Chest pain. EXAM: CHEST - 2 VIEW COMPARISON:  01/29/2023 FINDINGS: The heart size and mediastinal contours are within normal limits. Stable mild pulmonary interstitial  prominence. There is no evidence of pulmonary edema, consolidation, pneumothorax, nodule or pleural fluid. The visualized skeletal structures are unremarkable. IMPRESSION: No active cardiopulmonary disease. Stable mild pulmonary interstitial prominence. Electronically Signed   By: Marcey Moan M.D.   On: 11/07/2023 10:10     Procedures   Medications Ordered in the ED - No data to display                                  Medical Decision Making Amount and/or Complexity of Data Reviewed Labs: ordered. Radiology: ordered.   43 yo F with a chief complaints of chest pain.  Atypical in nature going on since yesterday.  No significant change in the past 6 hours.  Will obtain a single troponin chest x-ray.  She is PERC negative.  Chest x-ray independently interpreted by me without focal infiltrate pneumothorax.  No anemia, no significant electrolyte abnormalities.  Troponin is negative.  Will discharge home.  Treat as musculoskeletal.  PCP follow-up   11:39 AM:  I have discussed the diagnosis/risks/treatment options with the patient.  Evaluation and diagnostic testing in the emergency department does not suggest an emergent condition requiring admission or immediate intervention beyond what has been performed at this time.  They will follow up with PCP. We also discussed returning to the ED immediately if new or worsening sx occur. We discussed the sx which are most concerning (e.g., sudden worsening pain, fever, inability to tolerate by mouth) that necessitate immediate return. Medications administered to the patient during their visit and any new prescriptions provided to the patient are listed below.  Medications given during this visit Medications - No data to display   The patient appears reasonably screen and/or stabilized for discharge and I doubt any other medical condition or other [facility] requiring further screening, evaluation, or treatment in the ED at this time prior to discharge.        Final diagnoses:  Nonspecific chest pain    ED Discharge Orders     None          Emil Share, DO 11/07/23 1139

## 2023-11-07 NOTE — ED Notes (Signed)
 Patient is being discharged from the Urgent Care and sent to the Emergency Department via private vehicle . Per C. Johnie NP, patient is in need of higher level of care due to chest pain. Patient is aware and verbalizes understanding of plan of care.  Vitals:   11/07/23 0858  BP: 107/71  Pulse: 68  Resp: 16  Temp: 97.8 F (36.6 C)  SpO2: 98%

## 2023-11-07 NOTE — ED Notes (Signed)
 Patient discharged to home with self. Patient given written and oral discharge instructions. Patient verbalized understanding. Patient ambulatory to exit with steady gait. Patient breathing without difficulty. Patient denies questions, concerns or  needs at this time.

## 2023-11-07 NOTE — Discharge Instructions (Addendum)
 Please go to the emergency department for further evaluation of your chest pain.

## 2023-11-07 NOTE — ED Triage Notes (Signed)
 Pt states left sided chest pain since yesterday. States yesterday the pain was also in her left back.  States she has not taken anything for the pain.
